# Patient Record
Sex: Male | Born: 1975 | Race: White | Hispanic: No | Marital: Single | Smoking: Former smoker
Health system: Southern US, Community
[De-identification: ages and names within clinical notes are randomized; demographics above are authoritative.]

## PROBLEM LIST (undated history)

## (undated) DIAGNOSIS — I639 Cerebral infarction, unspecified: Secondary | ICD-10-CM

## (undated) DIAGNOSIS — A4902 Methicillin resistant Staphylococcus aureus infection, unspecified site: Secondary | ICD-10-CM

## (undated) DIAGNOSIS — B958 Unspecified staphylococcus as the cause of diseases classified elsewhere: Secondary | ICD-10-CM

## (undated) DIAGNOSIS — I10 Essential (primary) hypertension: Secondary | ICD-10-CM

## (undated) DIAGNOSIS — N200 Calculus of kidney: Secondary | ICD-10-CM

## (undated) HISTORY — PX: DENTAL SURGERY: SHX609

## (undated) SURGERY — Surgical Case
Anesthesia: *Unknown

---

## 2005-02-24 ENCOUNTER — Emergency Department: Payer: Self-pay | Admitting: Emergency Medicine

## 2005-08-02 ENCOUNTER — Emergency Department: Payer: Self-pay | Admitting: General Practice

## 2005-08-05 ENCOUNTER — Emergency Department: Payer: Self-pay | Admitting: Emergency Medicine

## 2006-02-03 ENCOUNTER — Emergency Department: Payer: Self-pay | Admitting: Emergency Medicine

## 2006-02-04 ENCOUNTER — Emergency Department (HOSPITAL_COMMUNITY): Admission: EM | Admit: 2006-02-04 | Discharge: 2006-02-04 | Payer: Self-pay | Admitting: *Deleted

## 2006-03-14 ENCOUNTER — Emergency Department: Payer: Self-pay | Admitting: General Practice

## 2006-03-14 ENCOUNTER — Other Ambulatory Visit: Payer: Self-pay

## 2006-03-15 ENCOUNTER — Emergency Department: Payer: Self-pay | Admitting: Emergency Medicine

## 2006-03-16 ENCOUNTER — Ambulatory Visit: Payer: Self-pay | Admitting: Emergency Medicine

## 2006-03-20 ENCOUNTER — Ambulatory Visit: Payer: Self-pay | Admitting: General Surgery

## 2006-10-13 ENCOUNTER — Emergency Department: Payer: Self-pay | Admitting: General Practice

## 2007-02-13 ENCOUNTER — Emergency Department: Payer: Self-pay | Admitting: Emergency Medicine

## 2007-11-27 ENCOUNTER — Other Ambulatory Visit: Payer: Self-pay

## 2007-11-27 ENCOUNTER — Emergency Department: Payer: Self-pay | Admitting: Emergency Medicine

## 2009-06-13 ENCOUNTER — Emergency Department: Payer: Self-pay | Admitting: Emergency Medicine

## 2010-05-03 ENCOUNTER — Emergency Department: Payer: Self-pay | Admitting: Emergency Medicine

## 2011-05-08 ENCOUNTER — Emergency Department: Payer: Self-pay | Admitting: Emergency Medicine

## 2013-05-03 ENCOUNTER — Emergency Department: Payer: Self-pay | Admitting: Emergency Medicine

## 2013-05-03 LAB — CBC
HCT: 41.7 % (ref 40.0–52.0)
MCHC: 34.4 g/dL (ref 32.0–36.0)
RBC: 4.61 10*6/uL (ref 4.40–5.90)
RDW: 13.5 % (ref 11.5–14.5)
WBC: 15.2 10*3/uL — ABNORMAL HIGH (ref 3.8–10.6)

## 2013-05-03 LAB — URINALYSIS, COMPLETE
Blood: NEGATIVE
Glucose,UR: NEGATIVE mg/dL (ref 0–75)
Nitrite: NEGATIVE
Ph: 7 (ref 4.5–8.0)
Protein: 30
RBC,UR: <1 /HPF (ref 0–5)
Specific Gravity: 1.025 (ref 1.003–1.030)
Squamous Epithelial: <1
WBC UR: 2 /HPF (ref 0–5)

## 2013-05-03 LAB — BASIC METABOLIC PANEL
Anion Gap: 4 — ABNORMAL LOW (ref 7–16)
EGFR (African American): >60
Glucose: 95 mg/dL (ref 65–99)
Osmolality: 282 (ref 275–301)
Sodium: 141 mmol/L (ref 136–145)

## 2015-05-01 ENCOUNTER — Other Ambulatory Visit: Payer: Self-pay

## 2015-05-01 ENCOUNTER — Encounter: Payer: Self-pay | Admitting: Medical Oncology

## 2015-05-01 ENCOUNTER — Emergency Department
Admission: EM | Admit: 2015-05-01 | Discharge: 2015-05-01 | Disposition: A | Discharge status: Home / Self Care | Payer: Self-pay | Attending: Emergency Medicine | Admitting: Emergency Medicine

## 2015-05-01 ENCOUNTER — Emergency Department: Payer: Self-pay

## 2015-05-01 DIAGNOSIS — H5711 Ocular pain, right eye: Secondary | ICD-10-CM | POA: Insufficient documentation

## 2015-05-01 DIAGNOSIS — R5383 Other fatigue: Secondary | ICD-10-CM | POA: Insufficient documentation

## 2015-05-01 DIAGNOSIS — R5381 Other malaise: Secondary | ICD-10-CM

## 2015-05-01 DIAGNOSIS — Z720 Tobacco use: Secondary | ICD-10-CM | POA: Insufficient documentation

## 2015-05-01 LAB — CBC
HEMATOCRIT: 43.6 % (ref 40.0–52.0)
HEMOGLOBIN: 14.5 g/dL (ref 13.0–18.0)
MCH: 30.6 pg (ref 26.0–34.0)
MCHC: 33.3 g/dL (ref 32.0–36.0)
MCV: 91.9 fL (ref 80.0–100.0)
Platelets: 352 10*3/uL (ref 150–440)
RBC: 4.74 MIL/uL (ref 4.40–5.90)
RDW: 14.4 % (ref 11.5–14.5)
WBC: 9.1 10*3/uL (ref 3.8–10.6)

## 2015-05-01 LAB — URINALYSIS COMPLETE WITH MICROSCOPIC (ARMC ONLY)
BILIRUBIN URINE: NEGATIVE
Bacteria, UA: NONE SEEN
Glucose, UA: NEGATIVE mg/dL
HGB URINE DIPSTICK: NEGATIVE
KETONES UR: NEGATIVE mg/dL
LEUKOCYTES UA: NEGATIVE
Nitrite: NEGATIVE
PH: 5 (ref 5.0–8.0)
Protein, ur: NEGATIVE mg/dL
Specific Gravity, Urine: 1.017 (ref 1.005–1.030)

## 2015-05-01 LAB — BASIC METABOLIC PANEL
ANION GAP: 7 (ref 5–15)
BUN: 13 mg/dL (ref 6–20)
CHLORIDE: 105 mmol/L (ref 101–111)
CO2: 24 mmol/L (ref 22–32)
Calcium: 8.8 mg/dL — ABNORMAL LOW (ref 8.9–10.3)
Creatinine, Ser: 1.09 mg/dL (ref 0.61–1.24)
GFR calc Af Amer: >60 mL/min (ref >60)
GLUCOSE: 120 mg/dL — AB (ref 65–99)
POTASSIUM: 3.5 mmol/L (ref 3.5–5.1)
Sodium: 136 mmol/L (ref 135–145)

## 2015-05-01 MED ORDER — TETRACAINE HCL 0.5 % OP SOLN
1.0000 [drp] | Freq: Once | OPHTHALMIC | Status: AC
  Administered 2015-05-01: 1 [drp] via OPHTHALMIC
  Filled 2015-05-01: qty 2

## 2015-05-01 MED ORDER — FLUORESCEIN SODIUM 1 MG OP STRP
1.0000 | ORAL_STRIP | Freq: Once | OPHTHALMIC | Status: AC
  Administered 2015-05-01: 1 via OPHTHALMIC
  Filled 2015-05-01: qty 1

## 2015-05-01 NOTE — ED Provider Notes (Signed)
[facility] Emergency Department Provider Note  ____________________________________________  Time seen: Approximately 10:24 PM  I have reviewed the triage vital signs and the nursing notes.   HISTORY  Chief Complaint Numbness and Dizziness    HPI Steven Mclean is a 39 y.o. male with vague symptoms that started about 4 days ago.  [place] the [place] and reports that he was having some difficulty with ambulation, some left-sided numbness, some stinging and burning in his right eye, and that he just "does not feel normal".  He denies fever/chills, chest pain, shortness of breath, abdominal pain, nausea, vomiting, diarrhea, and dysuria.  He has never had symptoms like this in the past.  He does not have a primary care doctor.  Regarding his right eye pain, it is been intermittent for several days.  He recalls no trauma or foreign body exposure.   History reviewed. No pertinent past medical history.  There are no active problems to display for this patient.   Past Surgical History  Procedure Laterality Date  . Dental surgery      Current Outpatient Rx  Name  Route  Sig  Dispense  Refill  . diphenhydrAMINE (SOMINEX) 25 MG tablet   Oral   Take 25 mg by mouth at bedtime as needed for allergies or sleep.         Marland Kitchen ibuprofen (ADVIL,MOTRIN) 200 MG tablet   Oral   Take 200 mg by mouth every 6 (six) hours as needed.           Allergies Review of patient's allergies indicates no known allergies.  No family history on file.  Social History Social History  Substance Use Topics  . Smoking status: Current Every Day Smoker -- 1.00 packs/day    Types: Cigarettes  . Smokeless tobacco: None  . Alcohol Use: No    Review of Systems Constitutional: No fever/chills.  General malaise. Eyes: No visual changes.  Pain in the right eye that is burning and intermittent. ENT: No sore throat. Cardiovascular: Denies chest pain. Respiratory: Denies shortness of  breath. Gastrointestinal: No abdominal pain.  No nausea, no vomiting.  No diarrhea.  No constipation. Genitourinary: Negative for dysuria. Musculoskeletal: Negative for back pain. Skin: Negative for rash. Neurological: Hx of weakness, more left than right, non-specific  10-point ROS otherwise negative.  ____________________________________________   PHYSICAL EXAM:  VITAL SIGNS: ED Triage Vitals  Enc Vitals Group     BP 05/01/15 1807 140/81 mmHg     Pulse Rate 05/01/15 1807 82     Resp 05/01/15 1807 18     Temp 05/01/15 1807 97.8 F (36.6 C)     Temp Source 05/01/15 1807 Oral     SpO2 05/01/15 1807 98 %     Weight 05/01/15 1807 170 lb (77.111 kg)     Height 05/01/15 1807  (1.727 m)     Head Cir --      Peak Flow --      Pain Score --      Pain Loc --      Pain Edu? --      Excl. in GC? --     Constitutional: Alert and oriented. Well appearing and in no acute distress.  Appears very healthy at baseline - well-developed and muscular. Eyes: Conjunctivae are normal. PERRL. EOMI.  Performed right eye exam with tetracaine, fluorescein and Wood's lamp with no evidence of corneal abrasion, foreign body, dendritic pattern, etc.;  Normal exam, no Seidel sign  Head: Atraumatic. Nose: No congestion/rhinnorhea. Mouth/Throat: Mucous membranes are moist.  Oropharynx non-erythematous. Neck: No stridor.   Cardiovascular: Normal rate, regular rhythm. Grossly normal heart sounds.  Good peripheral circulation. Respiratory: Normal respiratory effort.  No retractions. Lungs CTAB. Gastrointestinal: Soft and nontender. No distention. No abdominal bruits. No CVA tenderness. Musculoskeletal: No lower extremity tenderness nor edema.  No joint effusions. Neurologic:  Normal speech and language. No gross focal neurologic deficits are appreciated.  Patient has strength intact in all 4 extremities.  He has normal sensation throughout.  His cerebellar exam is normal.  He ambulates easily with a normal  gait. Skin:  Skin is warm, dry and intact. No rash noted. Psychiatric: Mood and affect are normal. Speech and behavior are normal.  ____________________________________________   LABS (all labs ordered are listed, but only abnormal results are displayed)  Labs Reviewed  BASIC METABOLIC PANEL - Abnormal; Notable for the following:    Glucose, Bld 120 (*)    Calcium 8.8 (*)    All other components within normal limits  URINALYSIS COMPLETEWITH MICROSCOPIC ([facility] ONLY) - Abnormal; Notable for the following:    Color, Urine YELLOW (*)    APPearance CLEAR (*)    Squamous Epithelial / LPF 0-5 (*)    All other components within normal limits  CBC  CBG MONITORING, ED   ____________________________________________  EKG  ED ECG REPORT I, Jalexus Brett, the attending physician, personally viewed and interpreted this ECG.  Date: 05/01/2015 EKG Time: 18:16 Rate: 74 Rhythm: normal sinus rhythm QRS Axis: Left axis deviation Intervals: Incomplete right bundle branch block ST/T Wave abnormalities: normal Conduction Disutrbances: none Narrative Interpretation: unremarkable  ____________________________________________  RADIOLOGY   Dg Chest 2 View  05/01/2015   CLINICAL DATA:  Weakness to the left side of body. Eyes burning, lips tingling, disorientation, and dizziness for 3 days. Smoker.  EXAM: CHEST  2 VIEW  COMPARISON:  05/08/2011  FINDINGS: Hyperinflation. The heart size and mediastinal contours are within normal limits. Both lungs are clear. The visualized skeletal structures are unremarkable.  IMPRESSION: No active cardiopulmonary disease.   Electronically Signed   By: Burman Nieves M.D.   On: 05/01/2015 22:46   Ct Head Wo Contrast  05/01/2015   CLINICAL DATA:  LEFT-sided arm and facial weakness starting 3 days ago.  EXAM: CT HEAD WITHOUT CONTRAST  TECHNIQUE: Contiguous axial images were obtained from the base of the skull through the vertex without intravenous contrast.   COMPARISON:  None.  FINDINGS: No acute intracranial hemorrhage. No focal mass lesion. No CT evidence of acute infarction. No midline shift or mass effect. No hydrocephalus. Basilar cisterns are patent.  Paranasal sinuses and  mastoid air cells are clear.  IMPRESSION: Normal head CT.   Electronically Signed   By: Genevive Bi M.D.   On: 05/01/2015 19:30    ____________________________________________   PROCEDURES  Procedure(s) performed: None  Critical Care performed: No ____________________________________________   INITIAL IMPRESSION / ASSESSMENT AND PLAN / ED COURSE  Pertinent labs & imaging results that were available during my care of the patient were reviewed by me and considered in my medical decision making (see chart for details).  The patient is well-appearing, healthy, and in no acute distress.  His neurological exam is intact.  He has had no chest pain or shortness of breath.  His workup is unremarkable.  He has no evidence of any injury to his right eye.  I gave him reassurance and recommended that he establish a primary care doctor  [facility].  I am also giving him the name of the local ophthalmologist for follow-up.  He continues to have pain in his right eye.  ____________________________________________  FINAL CLINICAL IMPRESSION(S) / ED DIAGNOSES  Final diagnoses:  Malaise and fatigue  Acute right eye pain      NEW MEDICATIONS STARTED DURING THIS VISIT:  New Prescriptions   No medications on file     Loleta Rose, MD 05/01/15 2318

## 2015-05-01 NOTE — ED Notes (Signed)
Pt ambulatory to triage with reports that he began having left sided numbness and tingling Friday morning, pt reports that since then he has began to have dizziness and states that he just does not feel normal. Denies Chest pain/headache.

## 2015-05-01 NOTE — Discharge Instructions (Signed)
As we discussed, your workup today was reassuring.  Though we do not know exactly what is causing your symptoms, it appears that you have no emergent medical condition at this time are safe to go home and follow up as recommended in this paperwork.  Please return immediately to the Emergency Department if you develop any new or worsening symptoms that concern you.   Weakness Weakness is a lack of strength. You may feel weak all over your body or just in one part of your body. Weakness can be serious. In some cases, you may need more medical tests. HOME CARE  Rest.  Eat a well-balanced diet.  Try to exercise every day.  Only take medicines as told by your doctor. GET HELP RIGHT AWAY IF:   You cannot do your normal daily activities.  You cannot walk up and down stairs, or you feel very tired when you do so.  You have shortness of breath or chest pain.  You have trouble moving parts of your body.  You have weakness in only one body part or on only one side of the body.  You have a fever.  You have trouble speaking or swallowing.  You cannot control when you pee (urinate) or poop (bowel movement).  You have black or bloody throw up (vomit) or poop.  Your weakness gets worse or spreads to other body parts.  You have new aches or pains. MAKE SURE YOU:   Understand these instructions.  Will watch your condition.  Will get help right away if you are not doing well or get worse. Document Released: 07/25/2008 Document Revised: 02/11/2012 Document Reviewed: 10/11/2011 [facility] Patient Information 2015 [place], [place]. This information is not intended to replace advice given to you by your health care provider. Make sure you discuss any questions you have with your health care provider.

## 2015-05-01 NOTE — ED Notes (Signed)
Medications given to MD per request. Steven Mclean lamp placed at pts bedside.

## 2015-05-27 DIAGNOSIS — I639 Cerebral infarction, unspecified: Secondary | ICD-10-CM

## 2015-05-27 HISTORY — DX: Cerebral infarction, unspecified: I63.9

## 2015-06-09 ENCOUNTER — Inpatient Hospital Stay
Admit: 2015-06-09 | Discharge: 2015-06-09 | Disposition: A | Discharge status: Home / Self Care | Payer: Self-pay | Attending: Internal Medicine | Admitting: Internal Medicine

## 2015-06-09 ENCOUNTER — Emergency Department: Payer: Self-pay

## 2015-06-09 ENCOUNTER — Inpatient Hospital Stay
Admission: EM | Admit: 2015-06-09 | Discharge: 2015-06-13 | DRG: 066 | Disposition: A | Discharge status: Home / Self Care | Payer: Self-pay | Attending: Internal Medicine | Admitting: Internal Medicine

## 2015-06-09 ENCOUNTER — Inpatient Hospital Stay: Payer: Self-pay

## 2015-06-09 ENCOUNTER — Emergency Department
Admission: EM | Admit: 2015-06-09 | Discharge: 2015-06-09 | Discharge status: Against Medical Advice | Payer: Self-pay | Attending: Emergency Medicine | Admitting: Emergency Medicine

## 2015-06-09 DIAGNOSIS — Z79899 Other long term (current) drug therapy: Secondary | ICD-10-CM

## 2015-06-09 DIAGNOSIS — Z716 Tobacco abuse counseling: Secondary | ICD-10-CM

## 2015-06-09 DIAGNOSIS — R4781 Slurred speech: Secondary | ICD-10-CM | POA: Diagnosis present

## 2015-06-09 DIAGNOSIS — B349 Viral infection, unspecified: Secondary | ICD-10-CM | POA: Diagnosis present

## 2015-06-09 DIAGNOSIS — I2510 Atherosclerotic heart disease of native coronary artery without angina pectoris: Secondary | ICD-10-CM | POA: Diagnosis present

## 2015-06-09 DIAGNOSIS — I639 Cerebral infarction, unspecified: Principal | ICD-10-CM | POA: Diagnosis present

## 2015-06-09 DIAGNOSIS — I739 Peripheral vascular disease, unspecified: Secondary | ICD-10-CM | POA: Diagnosis present

## 2015-06-09 DIAGNOSIS — K219 Gastro-esophageal reflux disease without esophagitis: Secondary | ICD-10-CM | POA: Diagnosis present

## 2015-06-09 DIAGNOSIS — R29810 Facial weakness: Secondary | ICD-10-CM | POA: Diagnosis present

## 2015-06-09 DIAGNOSIS — I69391 Dysphagia following cerebral infarction: Secondary | ICD-10-CM

## 2015-06-09 DIAGNOSIS — R509 Fever, unspecified: Secondary | ICD-10-CM | POA: Insufficient documentation

## 2015-06-09 DIAGNOSIS — R471 Dysarthria and anarthria: Secondary | ICD-10-CM | POA: Diagnosis present

## 2015-06-09 DIAGNOSIS — Z7902 Long term (current) use of antithrombotics/antiplatelets: Secondary | ICD-10-CM

## 2015-06-09 DIAGNOSIS — R1310 Dysphagia, unspecified: Secondary | ICD-10-CM | POA: Diagnosis present

## 2015-06-09 DIAGNOSIS — Z720 Tobacco use: Secondary | ICD-10-CM | POA: Insufficient documentation

## 2015-06-09 DIAGNOSIS — Z7982 Long term (current) use of aspirin: Secondary | ICD-10-CM

## 2015-06-09 DIAGNOSIS — E785 Hyperlipidemia, unspecified: Secondary | ICD-10-CM | POA: Diagnosis present

## 2015-06-09 DIAGNOSIS — I63019 Cerebral infarction due to thrombosis of unspecified vertebral artery: Secondary | ICD-10-CM

## 2015-06-09 DIAGNOSIS — F17210 Nicotine dependence, cigarettes, uncomplicated: Secondary | ICD-10-CM | POA: Diagnosis present

## 2015-06-09 LAB — URINE DRUG SCREEN, QUALITATIVE (ARMC ONLY)
AMPHETAMINES, UR SCREEN: NOT DETECTED
BARBITURATES, UR SCREEN: NOT DETECTED
Benzodiazepine, Ur Scrn: NOT DETECTED
COCAINE METABOLITE, UR ~~LOC~~: NOT DETECTED
Cannabinoid 50 Ng, Ur ~~LOC~~: POSITIVE — AB
MDMA (Ecstasy)Ur Screen: NOT DETECTED
METHADONE SCREEN, URINE: NOT DETECTED
OPIATE, UR SCREEN: NOT DETECTED
PHENCYCLIDINE (PCP) UR S: NOT DETECTED
Tricyclic, Ur Screen: NOT DETECTED

## 2015-06-09 LAB — CBC WITH DIFFERENTIAL/PLATELET
Basophils Absolute: 0.1 10*3/uL (ref 0–0.1)
Basophils Relative: 1 %
Eosinophils Absolute: 0.1 10*3/uL (ref 0–0.7)
Eosinophils Relative: 1 %
HCT: 43.6 % (ref 40.0–52.0)
Hemoglobin: 14.4 g/dL (ref 13.0–18.0)
Lymphocytes Relative: 18 %
Lymphs Abs: 2.1 10*3/uL (ref 1.0–3.6)
MCH: 29.8 pg (ref 26.0–34.0)
MCHC: 32.9 g/dL (ref 32.0–36.0)
MCV: 90.6 fL (ref 80.0–100.0)
Monocytes Absolute: 0.7 10*3/uL (ref 0.2–1.0)
Monocytes Relative: 7 %
Neutro Abs: 8.4 10*3/uL — ABNORMAL HIGH (ref 1.4–6.5)
Neutrophils Relative %: 73 %
Platelets: 384 10*3/uL (ref 150–440)
RBC: 4.81 MIL/uL (ref 4.40–5.90)
RDW: 14.7 % — ABNORMAL HIGH (ref 11.5–14.5)
WBC: 11.5 10*3/uL — ABNORMAL HIGH (ref 3.8–10.6)

## 2015-06-09 LAB — COMPREHENSIVE METABOLIC PANEL
ALT: 40 U/L (ref 17–63)
AST: 25 U/L (ref 15–41)
Albumin: 4 g/dL (ref 3.5–5.0)
Alkaline Phosphatase: 79 U/L (ref 38–126)
Anion gap: 6 (ref 5–15)
BUN: 13 mg/dL (ref 6–20)
CO2: 24 mmol/L (ref 22–32)
Calcium: 9.4 mg/dL (ref 8.9–10.3)
Chloride: 112 mmol/L — ABNORMAL HIGH (ref 101–111)
Creatinine, Ser: 0.84 mg/dL (ref 0.61–1.24)
GFR calc Af Amer: >60 mL/min (ref >60)
GFR calc non Af Amer: >60 mL/min (ref >60)
Glucose, Bld: 107 mg/dL — ABNORMAL HIGH (ref 65–99)
Potassium: 4.3 mmol/L (ref 3.5–5.1)
Sodium: 142 mmol/L (ref 135–145)
Total Bilirubin: 0.7 mg/dL (ref 0.3–1.2)
Total Protein: 7.1 g/dL (ref 6.5–8.1)

## 2015-06-09 LAB — URINALYSIS COMPLETE WITH MICROSCOPIC (ARMC ONLY)
Bacteria, UA: NONE SEEN
Bilirubin Urine: NEGATIVE
Glucose, UA: NEGATIVE mg/dL
Hgb urine dipstick: NEGATIVE
Ketones, ur: NEGATIVE mg/dL
Leukocytes, UA: NEGATIVE
Nitrite: NEGATIVE
Protein, ur: NEGATIVE mg/dL
RBC / HPF: NONE SEEN RBC/hpf (ref 0–5)
Specific Gravity, Urine: 1.019 (ref 1.005–1.030)
Squamous Epithelial / HPF: NONE SEEN
WBC, UA: NONE SEEN WBC/hpf (ref 0–5)
pH: 7 (ref 5.0–8.0)

## 2015-06-09 LAB — TROPONIN I: Troponin I: <0.03 ng/mL (ref <0.031)

## 2015-06-09 LAB — ANTITHROMBIN III: AntiThromb III Func: 107 % (ref 75–120)

## 2015-06-09 LAB — SALICYLATE LEVEL: Salicylate Lvl: <4 mg/dL (ref 2.8–30.0)

## 2015-06-09 LAB — ACETAMINOPHEN LEVEL

## 2015-06-09 LAB — ETHANOL: Alcohol, Ethyl (B): <5 mg/dL (ref <5)

## 2015-06-09 MED ORDER — STROKE: EARLY STAGES OF RECOVERY BOOK
Freq: Once | Status: AC
  Administered 2015-06-09: 19:00:00

## 2015-06-09 MED ORDER — INFLUENZA VAC SPLIT QUAD 0.5 ML IM SUSY
0.5000 mL | PREFILLED_SYRINGE | INTRAMUSCULAR | Status: AC
Start: 2015-06-10 — End: 2015-06-13
  Administered 2015-06-13: 11:00:00 0.5 mL via INTRAMUSCULAR
  Filled 2015-06-09: qty 0.5

## 2015-06-09 MED ORDER — HEPARIN SODIUM (PORCINE) 5000 UNIT/ML IJ SOLN
5000.0000 [IU] | Freq: Three times a day (TID) | INTRAMUSCULAR | Status: DC
  Administered 2015-06-09 – 2015-06-13 (×10): 5000 [IU] via SUBCUTANEOUS
  Filled 2015-06-09 (×10): qty 1

## 2015-06-09 MED ORDER — ASPIRIN 81 MG PO CHEW
81.0000 mg | CHEWABLE_TABLET | Freq: Every day | ORAL | Status: DC
  Filled 2015-06-09: qty 1

## 2015-06-09 MED ORDER — PNEUMOCOCCAL VAC POLYVALENT 25 MCG/0.5ML IJ INJ
0.5000 mL | INJECTION | INTRAMUSCULAR | Status: AC
  Administered 2015-06-13: 0.5 mL via INTRAMUSCULAR
  Filled 2015-06-09: qty 0.5

## 2015-06-09 MED ORDER — GADOBENATE DIMEGLUMINE 529 MG/ML IV SOLN
15.0000 mL | Freq: Once | INTRAVENOUS | Status: AC | PRN
  Administered 2015-06-09: 15 mL via INTRAVENOUS

## 2015-06-09 MED ORDER — ASPIRIN 300 MG RE SUPP
300.0000 mg | Freq: Once | RECTAL | Status: DC
  Filled 2015-06-09: qty 1

## 2015-06-09 MED ORDER — ASPIRIN 81 MG PO CHEW
324.0000 mg | CHEWABLE_TABLET | Freq: Once | ORAL | Status: DC
  Filled 2015-06-09: qty 4

## 2015-06-09 NOTE — ED Notes (Signed)
Patient transported to CT 

## 2015-06-09 NOTE — ED Notes (Signed)
Patient transported to MRI 

## 2015-06-09 NOTE — ED Notes (Signed)
Patient transported for ECHO 

## 2015-06-09 NOTE — ED Provider Notes (Signed)
[facility] Emergency Department Provider Note  Time seen: 10:36 AM  I have reviewed the triage vital signs and the nursing notes.   HISTORY  Chief Complaint Altered Mental Status    HPI Steven Mclean is a 39 y.o. male with no past medical history who presents the emergency department with altered mental status. According to the patient for the last few days he has felt subjective fevers and chills, shaking and times, unable to eat. Mother is here with the patient states the patient will intermittently have slurred speech, then seems to resolve. Will intermittently not use his right arm, but then resolves, will occasionally be sweaty and complaining of how hot he is, and then at times complaining of how cold he is. Currently patient can only give a limited history. He is awake and alert, follows commands with limited effort. Has very tremulous speech, with a mild right facial droop. Denies drug use. Denies alcohol use.    History reviewed. No pertinent past medical history.  There are no active problems to display for this patient.   Past Surgical History  Procedure Laterality Date  . Dental surgery      Current Outpatient Rx  Name  Route  Sig  Dispense  Refill  . diphenhydrAMINE (SOMINEX) 25 MG tablet   Oral   Take 25 mg by mouth at bedtime as needed for allergies or sleep.         Marland Kitchen. ibuprofen (ADVIL,MOTRIN) 200 MG tablet   Oral   Take 200 mg by mouth every 6 (six) hours as needed.           Allergies Review of patient's allergies indicates no known allergies.  No family history on file.  Social History Social History  Substance Use Topics  . Smoking status: Current Every Day Smoker -- 1.00 packs/day    Types: Cigarettes  . Smokeless tobacco: None  . Alcohol Use: No    Review of Systems Constitutional: As it for subjective fevers/chills. Cardiovascular: Negative for chest pain. Respiratory: Negative for shortness of  breath. Gastrointestinal: Negative for abdominal pain. Mom states one episode of vomiting this morning. Genitourinary: Negative for dysuria. Neurological: Negative for . Occasional right arm stiffness. 10-point ROS otherwise negative.  ____________________________________________   PHYSICAL EXAM:  VITAL SIGNS: ED Triage Vitals  Enc Vitals Group     BP 06/09/15 1011 126/73 mmHg     Pulse Rate 06/09/15 1011 69     Resp 06/09/15 1011 18     Temp 06/09/15 1011 98.1 F (36.7 C)     Temp Source 06/09/15 1011 Oral     SpO2 06/09/15 1011 98 %     Weight 06/09/15 1011 165 lb (74.844 kg)     Height 06/09/15 1011 5\' 8"  (1.727 m)     Head Cir --      Peak Flow --      Pain Score --      Pain Loc --      Pain Edu? --      Excl. in GC? --    Constitutional: Alert. Will open eyes to voice, anterior some questions, very tremulous speech, difficult to understand. Most of the history is provided by his mother. Eyes: Normal exam, 3 mm PERRL ENT   Head: Normocephalic and atraumatic.   Mouth/Throat: Mucous membranes are moist. Cardiovascular: Normal rate, regular rhythm. No murmur Respiratory: Normal respiratory effort without tachypnea nor retractions. Breath sounds are clear  Gastrointestinal: Soft and nontender. No distention.  Musculoskeletal: Nontender with normal range of motion in all extremities.  Neurologic:  Tremulous speech, difficult to understand. Decreased strength in the right arm. Mild right facial droop present. It is difficult to discern what is effort related and what is a true neurologic deficit. Patient will occasionally use his right arm and appears to have full strength. Patient does have a mild right facial droop, but at times this also dissipates. Skin:  Skin is warm, dry and intact.  Psychiatric: Mood and affect are normal. Speech and behavior are normal.  ____________________________________________    EKG  EKG reviewed and interpreted by massage shows  sinus tachycardia 56 bpm, narrow QRS, normal axis, normal intervals, no concerning ST changes noted.  ____________________________________________    RADIOLOGY  CT within normal limits. Chest x-ray within normal limits. MRI shows acute pontine infarction. ____________________________________________    INITIAL IMPRESSION / ASSESSMENT AND PLAN / ED COURSE  Pertinent labs & imaging results that were available during my care of the patient were reviewed by me and considered in my medical decision making (see chart for details).  Patient presents for some confusion, tremulous speech, and possible neurologic deficit of the right arm and right face. At this time it is not clear what is causing his symptoms. The history is very difficult to obtain. The mother who gives most of the history does not live with the patient. Patient will not talk in sentences, will only use 1 or 2 words to answer questions. We will check brought labs, CT head, chest x-ray, EKG to help further evaluate. We'll closely monitor in the emergency department on telemetry. Of note the patient was in the emergency department overnight, states he waited several hours but was not seen so he left prior to being seen.  CT and chest x-ray show no acute findings. Labs are largely within normal limits besides cannabinoid positive urine tox. Patient's family is here now, they state the patient had the exact same symptoms of tremulous speech in September except at that time he was numb in his left side. He was seen in the emergency department with a workup showing no acute findings and discharged home. Given the patient's symptoms, tremulous speech, and right deficits we'll proceed with an MRI to rule out intracranial abnormalities such as CVA or MS.  MRI shows acute pontine infarction. This could possibly be the cause of the patient's symptoms today. We'll admit to the hospital for further evaluation and workup. We will dose aspirin in  the emergency department. Patient agreeable to plan.  ____________________________________________   FINAL CLINICAL IMPRESSION(S) / ED DIAGNOSES  Right-sided numbness Dysarthria   Minna Antis, MD 06/09/15 1456

## 2015-06-09 NOTE — Plan of Care (Signed)
Problem: Discharge/Transitional Outcomes Goal: Barriers To Progression Addressed/Resolved Individualization: Pt has no medical history.  Lives at home with spouse. Pt currently low fall risk per fall risk assessment, but bed alarm activated due to impulsiveness. Pt and spouse in agreement. Pt uses the urinal independently.    Goal: Hemodynamically stable Outcome: Progressing VSS. Denies pain. NIH score 3. A&Ox4, but impulsive. 2nd Swallow screen assessment done per Dr Juliene PinaMody, pt failed. Aspirin PR ordered pt refused.

## 2015-06-09 NOTE — ED Notes (Signed)
Pt c/o intermittent sweats with nausea for several days, was seen here last night for the same sx, pt was diaphoretic and pale on arrival and that has resolved in triage..states he will having sudden slurred speech, shivering, diaphoresis with nausea and then clear up after a few minutes..Marland Kitchen

## 2015-06-09 NOTE — H&P (Signed)
[facility] - [place] at [facility]   PATIENT NAME: Steven Mclean    MR#:  [id number]  DATE OF BIRTH:  1976-05-17  DATE OF ADMISSION:  06/09/2015  PRIMARY CARE PHYSICIAN: No PCP Per Patient   REQUESTING/REFERRING PHYSICIAN: Dr. Lenard Lance  CHIEF COMPLAINT:   Chief Complaint  Patient presents with  . Altered Mental Status   Right sided weakness, slurred speech  HISTORY OF PRESENT ILLNESS:  Steven Mclean  is a 38 y.o. male with no significant medical history presents to the emergency room with slurred speech, shaking, right arm weakness and altered mental status. He reports that he had similar symptoms about 1 month ago but at that time the weakness was on his left side. Those symptoms resolved. His current symptoms started last night and have gotten progressively worse. He has had shaking, sweats, chills. No nausea vomiting or diarrhea. No chest pain or palpitations. Family has noticed significant change in his speech and right-sided facial droop with some disorientation. They also state that since his last evaluation one month ago he has had difficulty swallowing with choking on water. 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  PAST MEDICAL HISTORY:  History reviewed. No pertinent past medical history.  PAST SURGICAL HISTORY:   Past Surgical History  Procedure Laterality Date  . Dental surgery      SOCIAL HISTORY:   Social History  Substance Use Topics  . Smoking status: Current Every Day Smoker -- 1.00 packs/day    Types: Cigarettes  . Smokeless tobacco: Not on file  . Alcohol Use: No   Denies IV drug use  FAMILY HISTORY:  No family history on file.   Positive for DVT in his mother and stroke in both his grandmother and grandfather on his mother's side  DRUG ALLERGIES:  No Known Allergies  REVIEW OF SYSTEMS:   Review of Systems  Constitutional:  Positive for fever, chills, malaise/fatigue and diaphoresis. Negative for weight loss.  HENT: Negative for congestion and hearing loss.   Eyes: Negative for blurred vision and pain.  Respiratory: Negative for cough, hemoptysis, sputum production, shortness of breath and stridor.   Cardiovascular: Negative for chest pain, palpitations, orthopnea and leg swelling.  Gastrointestinal: Negative for nausea, vomiting, abdominal pain, diarrhea, constipation and blood in stool.  Genitourinary: Negative for dysuria and frequency.  Musculoskeletal: Negative for myalgias, back pain, joint pain and neck pain.  Skin: Negative for rash.  Neurological: Positive for dizziness, tremors, speech change, focal weakness and weakness. Negative for loss of consciousness and headaches.  Endo/Heme/Allergies: Does not bruise/bleed easily.  Psychiatric/Behavioral: Negative for depression and hallucinations. The patient is not nervous/anxious.     MEDICATIONS AT HOME:   Prior to Admission medications   Not on File      VITAL SIGNS:  Blood pressure 143/75, pulse 55, temperature 98.1 F (36.7 C), temperature source Oral, resp. rate 24, height  (1.727 m), weight 74.844 kg (165 lb), SpO2 94 %.  PHYSICAL EXAMINATION:  GENERAL:  39 y.o.-year-old patient lying in the bed anxious, uncomfortable, leaning to the right EYES: Pupils equal, round, reactive to light and accommodation. No scleral icterus. Extraocular muscles intact.  HEENT: Head atraumatic, normocephalic. Oropharynx and nasopharynx clear. Oral mucous membranes pink and moist, poor dentition NECK:  Supple, no jugular venous distention. No thyroid enlargement, no tenderness.  LUNGS: Normal breath sounds bilaterally, no wheezing, rales,rhonchi or crepitation. No use of  accessory muscles of respiration.  CARDIOVASCULAR: S1, S2 normal. No murmurs, rubs, or gallops.  ABDOMEN: Soft, nontender, nondistended. Bowel sounds present. No organomegaly or mass. No  guarding no rebound EXTREMITIES: No pedal edema, cyanosis, or clubbing. Referral pulses 2+ NEUROLOGIC: Right-sided facial droop is present, right upper extremity weak and he keeps flexed, right lower extremity weakness, sensation is intact  PSYCHIATRIC: The patient is alert and oriented x 3. Anxious SKIN: No obvious rash, lesion, or ulcer. Multiple tattoos  LABORATORY PANEL:   CBC  Recent Labs Lab 06/09/15 1026  WBC 11.5*  HGB 14.4  HCT 43.6  PLT 384   ------------------------------------------------------------------------------------------------------------------  Chemistries   Recent Labs Lab 06/09/15 1026  NA 142  K 4.3  CL 112*  CO2 24  GLUCOSE 107*  BUN 13  CREATININE 0.84  CALCIUM 9.4  AST 25  ALT 40  ALKPHOS 79  BILITOT 0.7   ------------------------------------------------------------------------------------------------------------------  Cardiac Enzymes  Recent Labs Lab 06/09/15 1026  TROPONINI <0.03   ------------------------------------------------------------------------------------------------------------------  RADIOLOGY:  Ct Head Wo Contrast  06/09/2015  CLINICAL DATA:  Seven onset of slurred speech. Mild right-sided facial droop EXAM: CT HEAD WITHOUT CONTRAST TECHNIQUE: Contiguous axial images were obtained from the base of the skull through the vertex without intravenous contrast. COMPARISON:  May 01, 2015 FINDINGS: The ventricles are normal in size and configuration. There is no intracranial mass, hemorrhage, extra-axial fluid collection, or midline shift. The gray-white compartments are normal. No acute infarct evident. The bony calvarium appears intact. The mastoid air cells are clear. There is ethmoid sinus disease bilaterally. There is also right sphenoid sinus disease. There is evidence of an old fracture of the right anterior nasal bone. IMPRESSION: Paranasal sinus disease. Old fracture right nasal bone. No intracranial mass,  hemorrhage, or focal gray -white compartment lesions/acute appearing infarct. Electronically Signed   By: Bretta BangWilliam  Woodruff III M.D.   On: 06/09/2015 11:06   Mr Laqueta JeanBrain W ZOWo Contrast  06/09/2015  CLINICAL DATA:  Slurred speech with right-sided weakness. EXAM: MRI HEAD WITHOUT AND WITH CONTRAST TECHNIQUE: Multiplanar, multiecho pulse sequences of the brain and surrounding structures were obtained without and with intravenous contrast. CONTRAST:  15mL MULTIHANCE GADOBENATE DIMEGLUMINE 529 MG/ML IV SOLN COMPARISON:  Head CT from earlier today FINDINGS: Calvarium and upper cervical spine: No focal marrow signal abnormality. Orbits: No significant findings. Sinuses and Mastoids: Sinusitis with mucosal thickening greatest and posterior right ethmoid air cell. Brain: There is restricted diffusion in the left pons consistent with acute infarct. More inferiorly, there is gliosis in the right pons consistent with remote infarct. No other ischemic changes in the remainder of the brain. Normal flow related signal loss in the visualized vertebral and basilar arteries. No abnormal intracranial enhancement. No hemorrhage, hydrocephalus, or mass lesion. IMPRESSION: 1. Acute left pontine infarction. 2. Remote right pontine infarct. 3. No ischemic injury outside of the pons. Electronically Signed   By: Marnee SpringJonathon  Watts M.D.   On: 06/09/2015 14:43   Dg Chest Portable 1 View  06/09/2015  CLINICAL DATA:  Nausea and chills ; altered mental status EXAM: PORTABLE CHEST 1 VIEW COMPARISON:  May 01, 2015 FINDINGS: The lungs are clear. The heart size and pulmonary vascularity are normal. No adenopathy. No bone lesions. IMPRESSION: No edema or consolidation. Electronically Signed   By: Bretta BangWilliam  Woodruff III M.D.   On: 06/09/2015 10:53    EKG:   Orders placed or performed during the hospital encounter of 06/09/15  . ED EKG  . 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 Start aspirin. Obtain echocardiogram, carotid Dopplers, blood cultures, lipids, hypercoagulability panel. Will have physical, speech, occupational therapy consultations. We'll also consult neurology for recurrent strokes in this young patient. He is a smoker and may have family history of hypercoagulability. He is also had recent chills and rigors with negative UA and chest x-ray suggesting possible endocarditis. Denies IV drug use.  CODE STATUS: Full  TOTAL TIME TAKING CARE OF THIS PATIENT: 40 minutes.   Greater than 50% of time spent in coordination of care and counseling. Care plan discussed with the patient and his wife Marylene Land at the bedside.  Elby Showers M.D on 06/09/2015 at 3:35 PM  Between 7am to 6pm - Pager - [phone]  After 6pm go to [email or web address] - password [email or web address] [facility]  [facility]  Office  [phone]  CC: Primary care physician; No PCP Per Patient

## 2015-06-09 NOTE — ED Notes (Signed)
Pt returned from CT scan, pt follows commands to open eyes and identifies visitors in the room, speech slurred

## 2015-06-09 NOTE — ED Notes (Addendum)
Mother at bedside, MD at bedside, pt states name and place, pt trembling with slurred speech, right arm stiff, pt states he is cold one second and sweating the next, mild right sided facial droop noticed, pt denies any ETOH or drug abuse

## 2015-06-09 NOTE — Progress Notes (Signed)
*  PRELIMINARY RESULTS* Echocardiogram 2D Echocardiogram has been performed.  Steven Mclean 06/09/2015, 7:29 PM

## 2015-06-09 NOTE — ED Notes (Signed)
Patient with chills onset about two hours ago. Denies N/V. Denies sick contacts. Able to eat small amounts.

## 2015-06-10 ENCOUNTER — Inpatient Hospital Stay: Payer: Self-pay

## 2015-06-10 ENCOUNTER — Encounter: Payer: Self-pay | Admitting: Radiology

## 2015-06-10 DIAGNOSIS — I6302 Cerebral infarction due to thrombosis of basilar artery: Secondary | ICD-10-CM

## 2015-06-10 LAB — CBC
HCT: 41.2 % (ref 40.0–52.0)
Hemoglobin: 14.1 g/dL (ref 13.0–18.0)
MCH: 31.1 pg (ref 26.0–34.0)
MCHC: 34.3 g/dL (ref 32.0–36.0)
MCV: 90.7 fL (ref 80.0–100.0)
PLATELETS: 325 10*3/uL (ref 150–440)
RBC: 4.54 MIL/uL (ref 4.40–5.90)
RDW: 14.6 % — AB (ref 11.5–14.5)
WBC: 9.9 10*3/uL (ref 3.8–10.6)

## 2015-06-10 LAB — BASIC METABOLIC PANEL
Anion gap: 6 (ref 5–15)
BUN: 15 mg/dL (ref 6–20)
CALCIUM: 9 mg/dL (ref 8.9–10.3)
CO2: 24 mmol/L (ref 22–32)
CREATININE: 0.98 mg/dL (ref 0.61–1.24)
Chloride: 111 mmol/L (ref 101–111)
GLUCOSE: 85 mg/dL (ref 65–99)
Potassium: 3.7 mmol/L (ref 3.5–5.1)
Sodium: 141 mmol/L (ref 135–145)

## 2015-06-10 LAB — LIPID PANEL
CHOL/HDL RATIO: 3 ratio
CHOLESTEROL: 199 mg/dL (ref 0–200)
HDL: 66 mg/dL (ref >40)
LDL CALC: 124 mg/dL — AB (ref 0–99)
Triglycerides: 44 mg/dL (ref <150)
VLDL: 9 mg/dL (ref 0–40)

## 2015-06-10 LAB — HEMOGLOBIN A1C: Hgb A1c MFr Bld: 5.3 % (ref 4.0–6.0)

## 2015-06-10 LAB — TROPONIN I
Troponin I: <0.03 ng/mL (ref <0.031)
Troponin I: <0.03 ng/mL (ref <0.031)

## 2015-06-10 MED ORDER — PRAVASTATIN SODIUM 20 MG PO TABS
40.0000 mg | ORAL_TABLET | Freq: Every day | ORAL | Status: DC
  Administered 2015-06-10 – 2015-06-11 (×2): 40 mg via ORAL
  Filled 2015-06-10 (×2): qty 2

## 2015-06-10 MED ORDER — ASPIRIN EC 325 MG PO TBEC
325.0000 mg | DELAYED_RELEASE_TABLET | Freq: Every day | ORAL | Status: DC
  Administered 2015-06-10: 11:00:00 325 mg via ORAL
  Filled 2015-06-10: qty 1

## 2015-06-10 MED ORDER — IOHEXOL 350 MG/ML SOLN
75.0000 mL | Freq: Once | INTRAVENOUS | Status: AC | PRN
  Administered 2015-06-10: 15:00:00 75 mL via INTRAVENOUS

## 2015-06-10 NOTE — Progress Notes (Signed)
[facility] - [place] at [facility]   PATIENT NAME: Steven Mclean    MR#:  [id number]  DATE OF BIRTH:  Jan 22, 1976  SUBJECTIVE:  CHIEF COMPLAINT:  Patient is resting comfortably. Passed swallow evaluation. Denies any chest pain or shortness of breath. No extremity weakness. Denies any headache.  REVIEW OF SYSTEMS:  CONSTITUTIONAL: No fever, fatigue or weakness.  EYES: No blurred or double vision.  EARS, NOSE, AND THROAT: No tinnitus or ear pain.  RESPIRATORY: No cough, shortness of breath, wheezing or hemoptysis.  CARDIOVASCULAR: No chest pain, orthopnea, edema.  GASTROINTESTINAL: No nausea, vomiting, diarrhea or abdominal pain.  GENITOURINARY: No dysuria, hematuria.  ENDOCRINE: No polyuria, nocturia,  HEMATOLOGY: No anemia, easy bruising or bleeding SKIN: No rash or lesion. MUSCULOSKELETAL: No joint pain or arthritis.   NEUROLOGIC: No tingling, numbness, weakness.  PSYCHIATRY: No anxiety or depression.   DRUG ALLERGIES:  No Known Allergies  VITALS:  Blood pressure 114/58, pulse 56, temperature 97.9 F (36.6 C), temperature source Oral, resp. rate 18, height  (1.727 m), weight 76.295 kg (168 lb 3.2 oz), SpO2 99 %.  PHYSICAL EXAMINATION:  GENERAL:  39 y.o.-year-old patient lying in the bed with no acute distress.  EYES: Pupils equal, round, reactive to light and accommodation. No scleral icterus. Extraocular muscles intact.  HEENT: Head atraumatic, normocephalic. Oropharynx and nasopharynx clear.  NECK:  Supple, no jugular venous distention. No thyroid enlargement, no tenderness.  LUNGS: Normal breath sounds bilaterally, no wheezing, rales,rhonchi or crepitation. No use of accessory muscles of respiration.  CARDIOVASCULAR: S1, S2 normal. No murmurs, rubs, or gallops.  ABDOMEN: Soft, nontender, nondistended. Bowel sounds present. No organomegaly or mass.  EXTREMITIES: No pedal edema, cyanosis, or clubbing.  NEUROLOGIC: Cranial nerves II through XII  are intact. Muscle strength 5/5 in all extremities. Sensation intact. Gait not checked.  PSYCHIATRIC: The patient is alert and oriented x 3.  SKIN: No obvious rash, lesion, or ulcer. Multiple tattoos were noticed on the body   LABORATORY PANEL:   CBC  Recent Labs Lab 06/10/15 0246  WBC 9.9  HGB 14.1  HCT 41.2  PLT 325   ------------------------------------------------------------------------------------------------------------------  Chemistries   Recent Labs Lab 06/09/15 1026 06/10/15 0246  NA 142 141  K 4.3 3.7  CL 112* 111  CO2 24 24  GLUCOSE 107* 85  BUN 13 15  CREATININE 0.84 0.98  CALCIUM 9.4 9.0  AST 25  --   ALT 40  --   ALKPHOS 79  --   BILITOT 0.7  --    ------------------------------------------------------------------------------------------------------------------  Cardiac Enzymes  Recent Labs Lab 06/10/15 1021  TROPONINI <0.03   ------------------------------------------------------------------------------------------------------------------  RADIOLOGY:  Ct Head Wo Contrast  06/09/2015  CLINICAL DATA:  Seven onset of slurred speech. Mild right-sided facial droop EXAM: CT HEAD WITHOUT CONTRAST TECHNIQUE: Contiguous axial images were obtained from the base of the skull through the vertex without intravenous contrast. COMPARISON:  May 01, 2015 FINDINGS: The ventricles are normal in size and configuration. There is no intracranial mass, hemorrhage, extra-axial fluid collection, or midline shift. The gray-white compartments are normal. No acute infarct evident. The bony calvarium appears intact. The mastoid air cells are clear. There is ethmoid sinus disease bilaterally. There is also right sphenoid sinus disease. There is evidence of an old fracture of the right anterior nasal bone. IMPRESSION: Paranasal sinus disease. Old fracture right nasal bone. No intracranial mass, hemorrhage, or focal gray -white compartment lesions/acute appearing infarct.  Electronically Signed   By: Chrissie Noa  Margarita GrizzleWoodruff III M.D.   On: 06/09/2015 11:06   Mr Laqueta JeanBrain W ONWo Contrast  06/09/2015  CLINICAL DATA:  Slurred speech with right-sided weakness. EXAM: MRI HEAD WITHOUT AND WITH CONTRAST TECHNIQUE: Multiplanar, multiecho pulse sequences of the brain and surrounding structures were obtained without and with intravenous contrast. CONTRAST:  15mL MULTIHANCE GADOBENATE DIMEGLUMINE 529 MG/ML IV SOLN COMPARISON:  Head CT from earlier today FINDINGS: Calvarium and upper cervical spine: No focal marrow signal abnormality. Orbits: No significant findings. Sinuses and Mastoids: Sinusitis with mucosal thickening greatest and posterior right ethmoid air cell. Brain: There is restricted diffusion in the left pons consistent with acute infarct. More inferiorly, there is gliosis in the right pons consistent with remote infarct. No other ischemic changes in the remainder of the brain. Normal flow related signal loss in the visualized vertebral and basilar arteries. No abnormal intracranial enhancement. No hemorrhage, hydrocephalus, or mass lesion. IMPRESSION: 1. Acute left pontine infarction. 2. Remote right pontine infarct. 3. No ischemic injury outside of the pons. Electronically Signed   By: Marnee SpringJonathon  Watts M.D.   On: 06/09/2015 14:43   [place] Carotid Bilateral  06/09/2015  CLINICAL DATA:  Stroke, trouble swallowing, difficulty walking, speech disturbance. History of smoking. EXAM: BILATERAL CAROTID DUPLEX ULTRASOUND TECHNIQUE: Wallace CullensGray scale imaging, color Doppler and duplex ultrasound were performed of bilateral carotid and vertebral arteries in the neck. COMPARISON:  Brain MRI -earlier same day; head CT -earlier same day FINDINGS: Criteria: Quantification of carotid stenosis is based on velocity parameters that correlate the residual internal carotid diameter with NASCET-based stenosis levels, using the diameter of the distal internal carotid lumen as the denominator for stenosis measurement. The  following velocity measurements were obtained: RIGHT ICA:  115/36 cm/sec CCA:  155/37 cm/sec SYSTOLIC ICA/CCA RATIO:  0.7 DIASTOLIC ICA/CCA RATIO:  0.7 ECA:  129 cm/sec LEFT ICA:  117/39 cm/sec CCA:  163/37 cm/sec SYSTOLIC ICA/CCA RATIO:  0.7 DIASTOLIC ICA/CCA RATIO:  1.1 ECA:  123 cm/sec RIGHT CAROTID ARTERY: There is a minimal amount of eccentric mixed echogenic plaque within the right carotid bulb (images 16 and 17), extending to involve the origin and proximal aspect of the right internal carotid artery (image 24, not resulting in elevated peak systolic velocities within the interrogated course of the right internal carotid artery to suggest a hemodynamically significant stenosis. RIGHT VERTEBRAL ARTERY:  Antegrade flow LEFT CAROTID ARTERY: There is no grayscale evidence of significant intimal thickening or atherosclerotic plaque affecting the interrogated portions of the left carotid system. There are no elevated peak systolic velocities within the interrogated course of the left internal carotid artery to suggest a hemodynamically significant stenosis. LEFT VERTEBRAL ARTERY:  Antegrade flow IMPRESSION: 1. Very minimal amount of right-sided atherosclerotic plaque, not resulting in a hemodynamically significant stenosis. 2. Normal sonographic evaluation of the left carotid system. Electronically Signed   By: Simonne ComeJohn  Watts M.D.   On: 06/09/2015 16:43   Dg Chest Portable 1 View  06/09/2015  CLINICAL DATA:  Nausea and chills ; altered mental status EXAM: PORTABLE CHEST 1 VIEW COMPARISON:  May 01, 2015 FINDINGS: The lungs are clear. The heart size and pulmonary vascularity are normal. No adenopathy. No bone lesions. IMPRESSION: No edema or consolidation. Electronically Signed   By: Bretta BangWilliam  Woodruff III M.D.   On: 06/09/2015 10:53    EKG:   Orders placed or performed during the hospital encounter of 06/09/15  . ED EKG  . 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  Patient passed bedside swallow evaluation  Start him on statin as LDL is at 124  Obtain echocardiogram,  carotid Dopplers normal  blood cultures pending . Patient is afebrile. Will start him on IV antibiotics if blood cultures are positive hypercoagulability panel pending  Will have physical, recommending outpatient physical therapy .Pending neurology consult for recurrent strokes in this young patient. Cardiology consult is placed for TEE to rule out embolic stroke  He is also had recent chills and rigors with negative UA and chest x-ray suggesting possible endocarditis. Denies IV drug use.  #2 subjective fever with chills Chest x-rays negative, UA is negative, blood cultures are pending. No cardiac murmur noticed Echocardiogram is pending TEE is ordered as there is a concern for embolic stroke Concern for endocarditis by the admitting physician but patient denies any IV drug use. Not febrile. Blood cultures are pending. No cardia murmur noticed. No leukocytosis either. Will consider IV antibiotics if cultures are positive or patient is febrile   #3 dysphagia PPI and outpatient GI follow-up is recommended   #4 tobacco abuse   counseled patient to quit smoking for 3-5 minutes. Agreeable. We'll start him on nicotine patch.      All the records are reviewed and case discussed with Care Management/Social Workerr. Management plans discussed with the patient, family and they are in agreement.  CODE STATUS: full   TOTAL TIME TAKING CARE OF THIS PATIENT: 35  minutes.   POSSIBLE D/C IN 2-3  DAYS, DEPENDING ON CLINICAL CONDITION.   Ramonita Lab M.D on 06/10/2015 at 2:03 PM  Between 7am to 6pm - Pager - [phone] After 6pm go to [email or web address] - password [email or web address] [facility]  [place] [facility]  Office  [phone]  CC: Primary care physician; No PCP Per Patient

## 2015-06-10 NOTE — Evaluation (Signed)
Physical Therapy Evaluation Patient Details Name: Steven Mclean MRN: [id number] DOB: 06-Jun-1976 Today's Date: 06/10/2015   History of Present Illness  Patient is a 39 y/o male that presents with R sided weakness and slurred speech. 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   Clinical Impression  Patient presents with higher level balance deficits during this mobility evaluation secondary to recent CVA(s). Patient is unable to safely get to tandem standing and can maintain, though only for short periods of time SLS. Patient demonstrates lateral loss of balance with head turns, turning corners, avoiding objects, and narrow BOS ambulation. He is able to complete eyes closed and retro ambulation, though noted to drift laterally as well. No sensation deficits, strength deficits, or dizziness complaints identified in this session. Patient demonstrates balance deficits indicative of increased falls risk, especially with his impulsivity. Attempted ambulation with SPC, which seemed to slow him down somewhat and reduce lateral loss of balance. Patient educated extensively on technique with SPC, and though it improved his gait he did struggle with mechanics. Patient would benefit from additional skilled PT services to address his balance deficits.     Follow Up Recommendations Outpatient PT    Equipment Recommendations  Cane    Recommendations for Other Services       Precautions / Restrictions Precautions Precautions: Fall Restrictions Weight Bearing Restrictions: No      Mobility  Bed Mobility Overal bed mobility: Modified Independent             General bed mobility comments: HOB elevated, no balance deficits noted.   Transfers Overall transfer level: Independent               General transfer comment: Patient displays use of RUE on bed rail to stabilize initially in standing on first attempt. Further attempts no loss of balance noted.    Ambulation/Gait Ambulation/Gait assistance: Min guard Ambulation Distance (Feet): 200 Feet (Additional gait with SPC as noted in assessment. )   Gait Pattern/deviations: Staggering left;Staggering right;Drifts right/left;Step-through pattern;Narrow base of support   Gait velocity interpretation: at or above normal speed for age/gender General Gait Details: Patient demonstrates decreased balance and lateral loss of balance with higher level gait tasks, head turns, and rounding corners.  Stairs            Wheelchair Mobility    Modified Rankin (Stroke Patients Only)       Balance                                 Standardized Balance Assessment Standardized Balance Assessment : Berg Balance Test;Dynamic Gait Index Berg Balance Test Sit to Stand: Able to stand  independently using hands Standing Unsupported: Able to stand safely 2 minutes Sitting with Back Unsupported but Feet Supported on Floor or Stool: Able to sit safely and securely 2 minutes Stand to Sit: Controls descent by using hands Transfers: Able to transfer safely, minor use of hands Standing Unsupported with Eyes Closed: Able to stand 10 seconds safely Standing Ubsupported with Feet Together: Able to place feet together independently and stand 1 minute safely From Standing Position, Pick up Object from Floor: Able to pick up shoe safely and easily Turn 360 Degrees: Able to turn 360 degrees safely in 4 seconds or less Standing Unsupported, Alternately Place Feet on Step/Stool: Able to stand independently and safely and complete 8 steps in 20 seconds Standing Unsupported, One Foot  in Front: Loses balance while stepping or standing Standing on One Leg: Able to lift leg independently and hold equal to or more than 3 seconds Dynamic Gait Index Level Surface: Normal Change in Gait Speed: Mild Impairment Gait with Horizontal Head Turns: Mild Impairment Gait with Vertical Head Turns: Mild  Impairment Gait and Pivot Turn: Normal Step Around Obstacles: Mild Impairment Steps: Moderate Impairment       Pertinent Vitals/Pain Pain Assessment: No/denies pain    Home Living Family/patient expects to be discharged to:: Private residence Living Arrangements: Spouse/significant other Available Help at Discharge: Family Type of Home: House Home Access: Stairs to enter;Ramped entrance   Entrance Stairs-Number of Steps: 3 Home Layout: One level   Additional Comments: He moved into his grandfather's old house after he passed. House is completely handicap accessible.     Prior Function Level of Independence: Independent         Comments: Patient reports he has been working, though notes balance deficits over the past month.      Hand Dominance        Extremity/Trunk Assessment   Upper Extremity Assessment: Overall WFL for tasks assessed           Lower Extremity Assessment: Overall WFL for tasks assessed         Communication   Communication: No difficulties  Cognition Arousal/Alertness: Awake/alert Behavior During Therapy: WFL for tasks assessed/performed;Impulsive (Patient does not make eye contact consistently and seems to have a difficult time staying on task) Overall Cognitive Status: Within Functional Limits for tasks assessed                      General Comments      Exercises Other Exercises Other Exercises: Educated and assessed patient with SPC for 300' of ambulation and up/down 4 steps with significant verbal and physical cuing for technique of use of SPC.       Assessment/Plan    PT Assessment Patient needs continued PT services  PT Diagnosis Difficulty walking   PT Problem List Decreased safety awareness;Decreased balance  PT Treatment Interventions DME instruction;Gait training;Stair training;Balance training;Therapeutic exercise;Therapeutic activities;Neuromuscular re-education   PT Goals (Current goals can be found in the  Care Plan section) Acute Rehab PT Goals Patient Stated Goal: To return home safely  PT Goal Formulation: With patient/family Time For Goal Achievement: 06/24/15 Potential to Achieve Goals: Good Additional Goals Additional Goal #1:  (Patient will display low falls risk on standardized outcome )    Frequency 7X/week   Barriers to discharge        Co-evaluation               End of Session Equipment Utilized During Treatment: Gait belt Activity Tolerance: Patient tolerated treatment well Patient left: in bed;with call bell/phone within reach;with family/visitor present;with bed alarm set Nurse Communication: Mobility status         Time: 1207-1231 PT Time Calculation (min) (ACUTE ONLY): 24 min   Charges:   PT Evaluation $Initial PT Evaluation Tier I: 1 Procedure PT Treatments $Gait Training: 8-22 mins   PT G Codes:       Kerin RansomPatrick A McNamara, PT, DPT    06/10/2015, 1:36 PM

## 2015-06-10 NOTE — Plan of Care (Addendum)
Problem: Discharge/Transitional Outcomes Goal: Other Discharge Outcomes/Goals Outcome: Progressing VSS. INH score 2. No neurological changes during the shift. Impulsive. Denies pain. Currently on dysphagia diet, tolerates it well. Not compliant with diet despite education. 1xassist with ambulation. Unsteady gait.

## 2015-06-10 NOTE — Consult Note (Signed)
Reason for Consult:CVA [place] Referring Physician: Dr Volanda Napoleon hospitalist  Steven Mclean is an 39 y.o. male.  HPI: Pt has hx of recent CVA and MRI suggesting multiple areas. Pt now has focal weakness. He has ahx of smoking and hyperlipidemia. No prior CAD or MI. He denies prior CVA or PVD. He now is getting rehab and PT.   History reviewed. No pertinent past medical history.  Past Surgical History  Procedure Laterality Date  . Dental surgery      Family History  Problem Relation Age of Onset  . Deep vein thrombosis Mother   . Stroke Other     Social History:  reports that he has been smoking Cigarettes.  He has been smoking about 1.00 pack per day. He does not have any smokeless tobacco history on file. He reports that he does not drink alcohol or use illicit drugs.  Allergies: No Known Allergies  Medications: I have reviewed the patient's current medications.  Results for orders placed or performed during the hospital encounter of 06/09/15 (from the past 48 hour(s))  Comprehensive metabolic panel     Status: Abnormal   Collection Time: 06/09/15 10:26 AM  Result Value Ref Range   Sodium 142 135 - 145 mmol/L   Potassium 4.3 3.5 - 5.1 mmol/L   Chloride 112 (H) 101 - 111 mmol/L   CO2 24 22 - 32 mmol/L   Glucose, Bld 107 (H) 65 - 99 mg/dL   BUN 13 6 - 20 mg/dL   Creatinine, Ser 0.84 0.61 - 1.24 mg/dL   Calcium 9.4 8.9 - 10.3 mg/dL   Total Protein 7.1 6.5 - 8.1 g/dL   Albumin 4.0 3.5 - 5.0 g/dL   AST 25 15 - 41 U/L   ALT 40 17 - 63 U/L   Alkaline Phosphatase 79 38 - 126 U/L   Total Bilirubin 0.7 0.3 - 1.2 mg/dL   GFR calc non Af Amer >60 >60 mL/min   GFR calc Af Amer >60 >60 mL/min    Comment: (NOTE) The eGFR has been calculated using the CKD EPI equation. This calculation has not been validated in all clinical situations. eGFR's persistently <60 mL/min signify possible Chronic Kidney Disease.    Anion gap 6 5 - 15  Ethanol     Status: None   Collection Time: 06/09/15  10:26 AM  Result Value Ref Range   Alcohol, Ethyl (B) <5 <5 mg/dL    Comment:        LOWEST DETECTABLE LIMIT FOR SERUM ALCOHOL IS 5 mg/dL FOR MEDICAL PURPOSES ONLY   Acetaminophen level     Status: Abnormal   Collection Time: 06/09/15 10:26 AM  Result Value Ref Range   Acetaminophen (Tylenol), Serum <10 (L) 10 - 30 ug/mL    Comment:        THERAPEUTIC CONCENTRATIONS VARY SIGNIFICANTLY. A RANGE OF 10-30 ug/mL MAY BE AN EFFECTIVE CONCENTRATION FOR MANY PATIENTS. HOWEVER, SOME ARE BEST TREATED AT CONCENTRATIONS OUTSIDE THIS RANGE. ACETAMINOPHEN CONCENTRATIONS >150 ug/mL AT 4 HOURS AFTER INGESTION AND >50 ug/mL AT 12 HOURS AFTER INGESTION ARE OFTEN ASSOCIATED WITH TOXIC REACTIONS.   Salicylate level     Status: None   Collection Time: 06/09/15 10:26 AM  Result Value Ref Range   Salicylate Lvl <2.7 2.8 - 30.0 mg/dL  Troponin I     Status: None   Collection Time: 06/09/15 10:26 AM  Result Value Ref Range   Troponin I <0.03 <0.031 ng/mL    Comment:  NO INDICATION OF MYOCARDIAL INJURY.   CBC with Differential     Status: Abnormal   Collection Time: 06/09/15 10:26 AM  Result Value Ref Range   WBC 11.5 (H) 3.8 - 10.6 K/uL   RBC 4.81 4.40 - 5.90 MIL/uL   Hemoglobin 14.4 13.0 - 18.0 g/dL   HCT 05.9 42.3 - 93.3 %   MCV 90.6 80.0 - 100.0 fL   MCH 29.8 26.0 - 34.0 pg   MCHC 32.9 32.0 - 36.0 g/dL   RDW 16.3 (H) 92.2 - 94.1 %   Platelets 384 150 - 440 K/uL   Neutrophils Relative % 73 %   Neutro Abs 8.4 (H) 1.4 - 6.5 K/uL   Lymphocytes Relative 18 %   Lymphs Abs 2.1 1.0 - 3.6 K/uL   Monocytes Relative 7 %   Monocytes Absolute 0.7 0.2 - 1.0 K/uL   Eosinophils Relative 1 %   Eosinophils Absolute 0.1 0 - 0.7 K/uL   Basophils Relative 1 %   Basophils Absolute 0.1 0 - 0.1 K/uL  Urinalysis complete, with microscopic     Status: Abnormal   Collection Time: 06/09/15 11:58 AM  Result Value Ref Range   Color, Urine YELLOW (A) YELLOW   APPearance CLOUDY (A) CLEAR   Glucose,  UA NEGATIVE NEGATIVE mg/dL   Bilirubin Urine NEGATIVE NEGATIVE   Ketones, ur NEGATIVE NEGATIVE mg/dL   Specific Gravity, Urine 1.019 1.005 - 1.030   Hgb urine dipstick NEGATIVE NEGATIVE   pH 7.0 5.0 - 8.0   Protein, ur NEGATIVE NEGATIVE mg/dL   Nitrite NEGATIVE NEGATIVE   Leukocytes, UA NEGATIVE NEGATIVE   RBC / HPF NONE SEEN 0 - 5 RBC/hpf   WBC, UA NONE SEEN 0 - 5 WBC/hpf   Bacteria, UA NONE SEEN NONE SEEN   Squamous Epithelial / LPF NONE SEEN NONE SEEN   Budding Yeast PRESENT    Amorphous Crystal PRESENT   Urine Drug Screen, Qualitative     Status: Abnormal   Collection Time: 06/09/15 11:58 AM  Result Value Ref Range   Tricyclic, Ur Screen NONE DETECTED NONE DETECTED   Amphetamines, Ur Screen NONE DETECTED NONE DETECTED   MDMA (Ecstasy)Ur Screen NONE DETECTED NONE DETECTED   Cocaine Metabolite,Ur [place] NONE DETECTED NONE DETECTED   Opiate, Ur Screen NONE DETECTED NONE DETECTED   Phencyclidine (PCP) Ur S NONE DETECTED NONE DETECTED   Cannabinoid 50 Ng, Ur  POSITIVE (A) NONE DETECTED   Barbiturates, Ur Screen NONE DETECTED NONE DETECTED   Benzodiazepine, Ur Scrn NONE DETECTED NONE DETECTED   Methadone Scn, Ur NONE DETECTED NONE DETECTED    Comment: (NOTE) 100  Tricyclics, urine               Cutoff 1000 ng/mL 200  Amphetamines, urine             Cutoff 1000 ng/mL 300  MDMA (Ecstasy), urine           Cutoff 500 ng/mL 400  Cocaine Metabolite, urine       Cutoff 300 ng/mL 500  Opiate, urine                   Cutoff 300 ng/mL 600  Phencyclidine (PCP), urine      Cutoff 25 ng/mL 700  Cannabinoid, urine              Cutoff 50 ng/mL 800  Barbiturates, urine             Cutoff 200 ng/mL 900  Benzodiazepine, urine           Cutoff 200 ng/mL 1000 Methadone, urine                Cutoff 300 ng/mL 1100 1200 The urine drug screen provides only a preliminary, unconfirmed 1300 analytical test result and should not be used for non-medical 1400 purposes. Clinical consideration and professional  judgment should 1500 be applied to any positive drug screen result due to possible 1600 interfering substances. A more specific alternate chemical method 1700 must be used in order to obtain a confirmed analytical result.  1800 Gas chromato graphy / mass spectrometry (GC/MS) is the preferred 1900 confirmatory method.   Antithrombin III     Status: None   Collection Time: 06/09/15  3:46 PM  Result Value Ref Range   AntiThromb III Func 107 75 - 120 %    Comment: Performed at [facility]  Culture, blood (routine x 2)     Status: None (Preliminary result)   Collection Time: 06/09/15  3:46 PM  Result Value Ref Range   Specimen Description BLOOD RIGHT FOREARM    Special Requests BOTTLES DRAWN AEROBIC AND ANAEROBIC  3CC    Culture NO GROWTH < 24 HOURS    Report Status PENDING   Culture, blood (routine x 2)     Status: None (Preliminary result)   Collection Time: 06/09/15  3:47 PM  Result Value Ref Range   Specimen Description BLOOD LEFT FOREARM    Special Requests BOTTLES DRAWN AEROBIC AND ANAEROBIC  6CC    Culture NO GROWTH < 24 HOURS    Report Status PENDING   Troponin I (q 6hr x 3)     Status: None   Collection Time: 06/09/15  9:32 PM  Result Value Ref Range   Troponin I <0.03 <0.031 ng/mL    Comment:        NO INDICATION OF MYOCARDIAL INJURY.   Troponin I (q 6hr x 3)     Status: None   Collection Time: 06/10/15  2:46 AM  Result Value Ref Range   Troponin I <0.03 <0.031 ng/mL    Comment:        NO INDICATION OF MYOCARDIAL INJURY.   Lipid panel     Status: Abnormal   Collection Time: 06/10/15  2:46 AM  Result Value Ref Range   Cholesterol 199 0 - 200 mg/dL   Triglycerides 44 <150 mg/dL   HDL 66 >40 mg/dL   Total CHOL/HDL Ratio 3.0 RATIO   VLDL 9 0 - 40 mg/dL   LDL Cholesterol 124 (H) 0 - 99 mg/dL    Comment:        Total Cholesterol/HDL:CHD Risk Coronary Heart Disease Risk Table                     Men   Women  1/2 Average Risk   3.4   3.3  Average Risk        5.0   4.4  2 X Average Risk   9.6   7.1  3 X Average Risk  23.4   11.0        Use the calculated Patient Ratio above and the CHD Risk Table to determine the patient's CHD Risk.        ATP III CLASSIFICATION (LDL):  <100     mg/dL   Optimal  100-129  mg/dL   Near or Above  Optimal  130-159  mg/dL   Borderline  160-189  mg/dL   High  >190     mg/dL   Very High   Hemoglobin A1c     Status: None   Collection Time: 06/10/15  2:46 AM  Result Value Ref Range   Hgb A1c MFr Bld 5.3 4.0 - 6.0 %  CBC     Status: Abnormal   Collection Time: 06/10/15  2:46 AM  Result Value Ref Range   WBC 9.9 3.8 - 10.6 K/uL   RBC 4.54 4.40 - 5.90 MIL/uL   Hemoglobin 14.1 13.0 - 18.0 g/dL   HCT 41.2 40.0 - 52.0 %   MCV 90.7 80.0 - 100.0 fL   MCH 31.1 26.0 - 34.0 pg   MCHC 34.3 32.0 - 36.0 g/dL   RDW 14.6 (H) 11.5 - 14.5 %   Platelets 325 150 - 440 K/uL  Basic metabolic panel     Status: None   Collection Time: 06/10/15  2:46 AM  Result Value Ref Range   Sodium 141 135 - 145 mmol/L   Potassium 3.7 3.5 - 5.1 mmol/L   Chloride 111 101 - 111 mmol/L   CO2 24 22 - 32 mmol/L   Glucose, Bld 85 65 - 99 mg/dL   BUN 15 6 - 20 mg/dL   Creatinine, Ser 0.98 0.61 - 1.24 mg/dL   Calcium 9.0 8.9 - 10.3 mg/dL   GFR calc non Af Amer >60 >60 mL/min   GFR calc Af Amer >60 >60 mL/min    Comment: (NOTE) The eGFR has been calculated using the CKD EPI equation. This calculation has not been validated in all clinical situations. eGFR's persistently <60 mL/min signify possible Chronic Kidney Disease.    Anion gap 6 5 - 15  Troponin I (q 6hr x 3)     Status: None   Collection Time: 06/10/15 10:21 AM  Result Value Ref Range   Troponin I <0.03 <0.031 ng/mL    Comment:        NO INDICATION OF MYOCARDIAL INJURY.     Ct Angio Head W/cm &/or Wo Cm  06/10/2015  CLINICAL DATA:  Left pontine stroke EXAM: CT ANGIOGRAPHY HEAD AND NECK TECHNIQUE: Multidetector CT imaging of the head and neck was  performed using the standard protocol during bolus administration of intravenous contrast. Multiplanar CT image reconstructions and MIPs were obtained to evaluate the vascular anatomy. Carotid stenosis measurements (when applicable) are obtained utilizing NASCET criteria, using the distal internal carotid diameter as the denominator. CONTRAST:  62mL OMNIPAQUE IOHEXOL 350 MG/ML SOLN COMPARISON:  MRI 06/09/2015 FINDINGS: CT HEAD Brain: Acute infarct left pons best seen on MRI. No hemorrhage. No other area of infarction. Ventricle size is normal. Calvarium and skull base: Negative Paranasal sinuses: Mucosal edema in the ethmoid sinuses bilaterally. No air-fluid level. Orbits: Negative CTA NECK Aortic arch: Normal aortic arch. Proximal great vessels widely patent. Lung apices clear. Right carotid system: Normal right carotid. No dissection or stenosis Left carotid system: Normal left carotid. No dissection or stenosis. Vertebral arteries:Both vertebral arteries widely patent. Right vertebral artery dominant. No dissection or stenosis in the vertebral arteries. Skeleton: No acute skeletal abnormality. Other neck: negative CTA HEAD Anterior circulation: Cavernous carotid widely patent bilaterally. No evidence of atherosclerotic disease stenosis or aneurysm. Anterior and middle cerebral arteries widely patent bilaterally Posterior circulation: Both vertebral arteries patent to the basilar without stenosis. PICA patent bilaterally. Basilar widely patent without stenosis or thrombus. Superior cerebellar and posterior cerebral arteries  patent bilaterally. Fetal origin right posterior cerebral artery with hypoplastic right P1 segment, a normal variant Venous sinuses: Patent Anatomic variants: None Delayed phase: Normal enhancement on delayed imaging. IMPRESSION: Acute infarct left pons, best seen on MRI. Normal CTA of the head and neck. Negative for atherosclerotic disease, dissection or thrombosis. Electronically Signed   By:  Franchot Gallo M.D.   On: 06/10/2015 15:34   Ct Head Wo Contrast  06/09/2015  CLINICAL DATA:  Seven onset of slurred speech. Mild right-sided facial droop EXAM: CT HEAD WITHOUT CONTRAST TECHNIQUE: Contiguous axial images were obtained from the base of the skull through the vertex without intravenous contrast. COMPARISON:  May 01, 2015 FINDINGS: The ventricles are normal in size and configuration. There is no intracranial mass, hemorrhage, extra-axial fluid collection, or midline shift. The gray-white compartments are normal. No acute infarct evident. The bony calvarium appears intact. The mastoid air cells are clear. There is ethmoid sinus disease bilaterally. There is also right sphenoid sinus disease. There is evidence of an old fracture of the right anterior nasal bone. IMPRESSION: Paranasal sinus disease. Old fracture right nasal bone. No intracranial mass, hemorrhage, or focal gray -white compartment lesions/acute appearing infarct. Electronically Signed   By: Lowella Grip III M.D.   On: 06/09/2015 11:06   Ct Angio Neck W/cm &/or Wo/cm  06/10/2015  CLINICAL DATA:  Left pontine stroke EXAM: CT ANGIOGRAPHY HEAD AND NECK TECHNIQUE: Multidetector CT imaging of the head and neck was performed using the standard protocol during bolus administration of intravenous contrast. Multiplanar CT image reconstructions and MIPs were obtained to evaluate the vascular anatomy. Carotid stenosis measurements (when applicable) are obtained utilizing NASCET criteria, using the distal internal carotid diameter as the denominator. CONTRAST:  47mL OMNIPAQUE IOHEXOL 350 MG/ML SOLN COMPARISON:  MRI 06/09/2015 FINDINGS: CT HEAD Brain: Acute infarct left pons best seen on MRI. No hemorrhage. No other area of infarction. Ventricle size is normal. Calvarium and skull base: Negative Paranasal sinuses: Mucosal edema in the ethmoid sinuses bilaterally. No air-fluid level. Orbits: Negative CTA NECK Aortic arch: Normal aortic  arch. Proximal great vessels widely patent. Lung apices clear. Right carotid system: Normal right carotid. No dissection or stenosis Left carotid system: Normal left carotid. No dissection or stenosis. Vertebral arteries:Both vertebral arteries widely patent. Right vertebral artery dominant. No dissection or stenosis in the vertebral arteries. Skeleton: No acute skeletal abnormality. Other neck: negative CTA HEAD Anterior circulation: Cavernous carotid widely patent bilaterally. No evidence of atherosclerotic disease stenosis or aneurysm. Anterior and middle cerebral arteries widely patent bilaterally Posterior circulation: Both vertebral arteries patent to the basilar without stenosis. PICA patent bilaterally. Basilar widely patent without stenosis or thrombus. Superior cerebellar and posterior cerebral arteries patent bilaterally. Fetal origin right posterior cerebral artery with hypoplastic right P1 segment, a normal variant Venous sinuses: Patent Anatomic variants: None Delayed phase: Normal enhancement on delayed imaging. IMPRESSION: Acute infarct left pons, best seen on MRI. Normal CTA of the head and neck. Negative for atherosclerotic disease, dissection or thrombosis. Electronically Signed   By: Franchot Gallo M.D.   On: 06/10/2015 15:34   Mr Jeri Cos ZO Contrast  06/09/2015  CLINICAL DATA:  Slurred speech with right-sided weakness. EXAM: MRI HEAD WITHOUT AND WITH CONTRAST TECHNIQUE: Multiplanar, multiecho pulse sequences of the brain and surrounding structures were obtained without and with intravenous contrast. CONTRAST:  62mL MULTIHANCE GADOBENATE DIMEGLUMINE 529 MG/ML IV SOLN COMPARISON:  Head CT from earlier today FINDINGS: Calvarium and upper cervical spine: No focal marrow signal abnormality.  Orbits: No significant findings. Sinuses and Mastoids: Sinusitis with mucosal thickening greatest and posterior right ethmoid air cell. Brain: There is restricted diffusion in the left pons consistent with  acute infarct. More inferiorly, there is gliosis in the right pons consistent with remote infarct. No other ischemic changes in the remainder of the brain. Normal flow related signal loss in the visualized vertebral and basilar arteries. No abnormal intracranial enhancement. No hemorrhage, hydrocephalus, or mass lesion. IMPRESSION: 1. Acute left pontine infarction. 2. Remote right pontine infarct. 3. No ischemic injury outside of the pons. Electronically Signed   By: Monte Fantasia M.D.   On: 06/09/2015 14:43   US Carotid Bilateral  06/09/2015  CLINICAL DATA:  Stroke, trouble swallowing, difficulty walking, speech disturbance. History of smoking. EXAM: BILATERAL CAROTID DUPLEX ULTRASOUND TECHNIQUE: Pearline Cables scale imaging, color Doppler and duplex ultrasound were performed of bilateral carotid and vertebral arteries in the neck. COMPARISON:  Brain MRI -earlier same day; head CT -earlier same day FINDINGS: Criteria: Quantification of carotid stenosis is based on velocity parameters that correlate the residual internal carotid diameter with NASCET-based stenosis levels, using the diameter of the distal internal carotid lumen as the denominator for stenosis measurement. The following velocity measurements were obtained: RIGHT ICA:  115/36 cm/sec CCA:  025/85 cm/sec SYSTOLIC ICA/CCA RATIO:  0.7 DIASTOLIC ICA/CCA RATIO:  0.7 ECA:  129 cm/sec LEFT ICA:  117/39 cm/sec CCA:  277/82 cm/sec SYSTOLIC ICA/CCA RATIO:  0.7 DIASTOLIC ICA/CCA RATIO:  1.1 ECA:  123 cm/sec RIGHT CAROTID ARTERY: There is a minimal amount of eccentric mixed echogenic plaque within the right carotid bulb (images 16 and 17), extending to involve the origin and proximal aspect of the right internal carotid artery (image 24, not resulting in elevated peak systolic velocities within the interrogated course of the right internal carotid artery to suggest a hemodynamically significant stenosis. RIGHT VERTEBRAL ARTERY:  Antegrade flow LEFT CAROTID ARTERY:  There is no grayscale evidence of significant intimal thickening or atherosclerotic plaque affecting the interrogated portions of the left carotid system. There are no elevated peak systolic velocities within the interrogated course of the left internal carotid artery to suggest a hemodynamically significant stenosis. LEFT VERTEBRAL ARTERY:  Antegrade flow IMPRESSION: 1. Very minimal amount of right-sided atherosclerotic plaque, not resulting in a hemodynamically significant stenosis. 2. Normal sonographic evaluation of the left carotid system. Electronically Signed   By: Sandi Mariscal M.D.   On: 06/09/2015 16:43   Dg Chest Portable 1 View  06/09/2015  CLINICAL DATA:  Nausea and chills ; altered mental status EXAM: PORTABLE CHEST 1 VIEW COMPARISON:  May 01, 2015 FINDINGS: The lungs are clear. The heart size and pulmonary vascularity are normal. No adenopathy. No bone lesions. IMPRESSION: No edema or consolidation. Electronically Signed   By: Lowella Grip III M.D.   On: 06/09/2015 10:53    Review of Systems  Constitutional: Positive for malaise/fatigue.  Eyes: Negative.   Respiratory: Negative.   Cardiovascular: Negative.   Gastrointestinal: Negative.   Genitourinary: Negative.   Musculoskeletal: Positive for myalgias.  Skin: Negative.   Neurological: Positive for tingling, sensory change, focal weakness, weakness and headaches.  Endo/Heme/Allergies: Negative.   Psychiatric/Behavioral: Negative.    Blood pressure 130/66, pulse 62, temperature 98.1 F (36.7 C), temperature source Oral, resp. rate 18, height $RemoveBe'5\' 8"'[id number]$  (1.727 m), weight 76.295 kg (168 lb 3.2 oz), SpO2 97 %. Physical Exam  Nursing note and vitals reviewed. Constitutional: He appears well-developed and well-nourished. He appears lethargic.  HENT:  Head: Normocephalic  and atraumatic.  Eyes: Conjunctivae are normal. Pupils are equal, round, and reactive to light.  Neck: Normal range of motion. Neck supple.  Cardiovascular:  Normal rate and regular rhythm.   Respiratory: Effort normal and breath sounds normal.  GI: Soft. Bowel sounds are normal.  Musculoskeletal: Normal range of motion.  Neurological: He appears lethargic. A sensory deficit is present. He exhibits abnormal muscle tone. Coordination and gait abnormal.  Skin: Skin is warm and dry.  Psychiatric: He has a normal mood and affect.    Assessment/Plan: CVA Smoking Hyperlipidemia Focal weakness . PLAN ASA long term for ascvd cONSIDER pLAVIX $RemoveBeforeD'75MG'[id number]$  QD Consider TEE for thrombus Agree with neurology input PT/OT for rehab Advice to quit smoking Continue lipid therapy   Leahanna Buser D. 06/10/2015, 10:00 PM

## 2015-06-10 NOTE — Plan of Care (Signed)
Problem: Discharge/Transitional Outcomes Goal: Other Discharge Outcomes/Goals Plan of care progress to goal: - No complaints of pain. - NIH= 3,  - Urinal at bedside. Will continue to monitor.

## 2015-06-10 NOTE — Consult Note (Signed)
CC: speech abnormality and R side weakness.    HPI: Steven Mclean is an 39 y.o. male with no significant medical history outside of smoking presents to the emergency room with slurred speech, shaking, right arm weakness and altered mental status which has improved.  He reports that he had similar symptoms about 1 month ago but at that time the weakness was on his left side. 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   History reviewed. No pertinent past medical history.  Past Surgical History  Procedure Laterality Date  . Dental surgery      Family History  Problem Relation Age of Onset  . Deep vein thrombosis Mother   . Stroke Other     Social History:  reports that he has been smoking Cigarettes.  He has been smoking about 1.00 pack per day. He does not have any smokeless tobacco history on file. He reports that he does not drink alcohol or use illicit drugs.  No Known Allergies  Medications: I have reviewed the patient's current medications.  ROS: History obtained from the patient  General ROS: negative for - chills, fatigue, fever, night sweats, weight gain or weight loss Psychological ROS: negative for - behavioral disorder, hallucinations, memory difficulties, mood swings or suicidal ideation Ophthalmic ROS: negative for - blurry vision, double vision, eye pain or loss of vision ENT ROS: negative for - epistaxis, nasal discharge, oral lesions, sore throat, tinnitus or vertigo Allergy and Immunology ROS: negative for - hives or itchy/watery eyes Hematological and Lymphatic ROS: negative for - bleeding problems, bruising or swollen lymph nodes Endocrine ROS: negative for - galactorrhea, hair pattern changes, polydipsia/polyuria or temperature intolerance Respiratory ROS: negative for - cough, hemoptysis, shortness of breath or wheezing Cardiovascular ROS: negative for - chest pain, dyspnea on exertion, edema or irregular  heartbeat Gastrointestinal ROS: negative for - abdominal pain, diarrhea, hematemesis, nausea/vomiting or stool incontinence Genito-Urinary ROS: negative for - dysuria, hematuria, incontinence or urinary frequency/urgency Musculoskeletal ROS: negative for - joint swelling or muscular weakness Neurological ROS: as noted in HPI Dermatological ROS: negative for rash and skin lesion changes  Physical Examination: Blood pressure 114/58, pulse 56, temperature 97.9 F (36.6 C), temperature source Oral, resp. rate 18, height  (1.727 m), weight 76.295 kg (168 lb 3.2 oz), SpO2 99 %.   Neurological Examination Mental Status: Alert, oriented, thought content appropriate.  Speech fluent without evidence of aphasia.  Able to follow 3 step commands without difficulty. Cranial Nerves: II: Discs flat bilaterally; Visual fields grossly normal, pupils equal, round, reactive to light and accommodation III,IV, VI: ptosis not present, extra-ocular motions intact bilaterally V,VII: smile symmetric, facial light touch sensation normal bilaterally VIII: hearing normal bilaterally IX,X: gag reflex present XI: bilateral shoulder shrug XII: midline tongue extension Motor: Right : Upper extremity   5/5    Left:     Upper extremity   5/5  Lower extremity   5/5     Lower extremity   5/5 Tone and bulk:normal tone throughout; no atrophy noted Sensory: Pinprick and light touch intact throughout, bilaterally Deep Tendon Reflexes: 1+ and symmetric throughout Plantars: Right: downgoing   Left: downgoing Cerebellar: normal finger-to-nose, normal rapid alternating movements and normal heel-to-shin test Gait: not tested.       Laboratory Studies:   Basic Metabolic Panel:  Recent Labs Lab 06/09/15 1026 06/10/15 0246  NA 142 141  K 4.3 3.7  CL 112* 111  CO2 24 24  GLUCOSE 107* 85  BUN 13 15  CREATININE 0.84 0.98  CALCIUM 9.4 9.0    Liver Function Tests:  Recent Labs Lab 06/09/15 1026  AST 25   ALT 40  ALKPHOS 79  BILITOT 0.7  PROT 7.1  ALBUMIN 4.0   No results for input(s): LIPASE, AMYLASE in the last 168 hours. No results for input(s): AMMONIA in the last 168 hours.  CBC:  Recent Labs Lab 06/09/15 1026 06/10/15 0246  WBC 11.5* 9.9  NEUTROABS 8.4*  --   HGB 14.4 14.1  HCT 43.6 41.2  MCV 90.6 90.7  PLT 384 325    Cardiac Enzymes:  Recent Labs Lab 06/09/15 1026 06/09/15 2132 06/10/15 0246 06/10/15 1021  TROPONINI <0.03 <0.03 <0.03 <0.03    BNP: Invalid input(s): POCBNP  CBG: No results for input(s): GLUCAP in the last 168 hours.  Microbiology: Results for orders placed or performed during the hospital encounter of 06/09/15  Culture, blood (routine x 2)     Status: None (Preliminary result)   Collection Time: 06/09/15  3:46 PM  Result Value Ref Range Status   Specimen Description BLOOD RIGHT FOREARM  Final   Special Requests BOTTLES DRAWN AEROBIC AND ANAEROBIC  3CC  Final   Culture NO GROWTH < 24 HOURS  Final   Report Status PENDING  Incomplete  Culture, blood (routine x 2)     Status: None (Preliminary result)   Collection Time: 06/09/15  3:47 PM  Result Value Ref Range Status   Specimen Description BLOOD LEFT FOREARM  Final   Special Requests BOTTLES DRAWN AEROBIC AND ANAEROBIC  6CC  Final   Culture NO GROWTH < 24 HOURS  Final   Report Status PENDING  Incomplete    Coagulation Studies: No results for input(s): LABPROT, INR in the last 72 hours.  Urinalysis:  Recent Labs Lab 06/09/15 1158  COLORURINE YELLOW*  LABSPEC 1.019  PHURINE 7.0  GLUCOSEU NEGATIVE  HGBUR NEGATIVE  BILIRUBINUR NEGATIVE  KETONESUR NEGATIVE  PROTEINUR NEGATIVE  NITRITE NEGATIVE  LEUKOCYTESUR NEGATIVE    Lipid Panel:     Component Value Date/Time   CHOL 199 06/10/2015 0246   TRIG 44 06/10/2015 0246   HDL 66 06/10/2015 0246   CHOLHDL 3.0 06/10/2015 0246   VLDL 9 06/10/2015 0246   LDLCALC 124* 06/10/2015 0246    HgbA1C: No results found for:  HGBA1C  Urine Drug Screen:     Component Value Date/Time   LABOPIA NONE DETECTED 06/09/2015 1158   LABBENZ NONE DETECTED 06/09/2015 1158   AMPHETMU NONE DETECTED 06/09/2015 1158   THCU POSITIVE* 06/09/2015 1158   LABBARB NONE DETECTED 06/09/2015 1158    Alcohol Level:  Recent Labs Lab 06/09/15 1026  ETH <5      Imaging: Ct Head Wo Contrast  06/09/2015  CLINICAL DATA:  Seven onset of slurred speech. Mild right-sided facial droop EXAM: CT HEAD WITHOUT CONTRAST TECHNIQUE: Contiguous axial images were obtained from the base of the skull through the vertex without intravenous contrast. COMPARISON:  May 01, 2015 FINDINGS: The ventricles are normal in size and configuration. There is no intracranial mass, hemorrhage, extra-axial fluid collection, or midline shift. The gray-white compartments are normal. No acute infarct evident. The bony calvarium appears intact. The mastoid air cells are clear. There is ethmoid sinus disease bilaterally. There is also right sphenoid sinus disease. There is evidence of an old fracture of the right anterior nasal bone. IMPRESSION: Paranasal sinus disease. Old fracture right nasal bone. No intracranial mass, hemorrhage, or  focal gray -white compartment lesions/acute appearing infarct. Electronically Signed   By: Bretta Bang III M.D.   On: 06/09/2015 11:06   Mr Laqueta Jean RU Contrast  06/09/2015  CLINICAL DATA:  Slurred speech with right-sided weakness. EXAM: MRI HEAD WITHOUT AND WITH CONTRAST TECHNIQUE: Multiplanar, multiecho pulse sequences of the brain and surrounding structures were obtained without and with intravenous contrast. CONTRAST:  15mL MULTIHANCE GADOBENATE DIMEGLUMINE 529 MG/ML IV SOLN COMPARISON:  Head CT from earlier today FINDINGS: Calvarium and upper cervical spine: No focal marrow signal abnormality. Orbits: No significant findings. Sinuses and Mastoids: Sinusitis with mucosal thickening greatest and posterior right ethmoid air cell.  Brain: There is restricted diffusion in the left pons consistent with acute infarct. More inferiorly, there is gliosis in the right pons consistent with remote infarct. No other ischemic changes in the remainder of the brain. Normal flow related signal loss in the visualized vertebral and basilar arteries. No abnormal intracranial enhancement. No hemorrhage, hydrocephalus, or mass lesion. IMPRESSION: 1. Acute left pontine infarction. 2. Remote right pontine infarct. 3. No ischemic injury outside of the pons. Electronically Signed   By: Marnee Spring M.D.   On: 06/09/2015 14:43   US Carotid Bilateral  06/09/2015  CLINICAL DATA:  Stroke, trouble swallowing, difficulty walking, speech disturbance. History of smoking. EXAM: BILATERAL CAROTID DUPLEX ULTRASOUND TECHNIQUE: Wallace Cullens scale imaging, color Doppler and duplex ultrasound were performed of bilateral carotid and vertebral arteries in the neck. COMPARISON:  Brain MRI -earlier same day; head CT -earlier same day FINDINGS: Criteria: Quantification of carotid stenosis is based on velocity parameters that correlate the residual internal carotid diameter with NASCET-based stenosis levels, using the diameter of the distal internal carotid lumen as the denominator for stenosis measurement. The following velocity measurements were obtained: RIGHT ICA:  115/36 cm/sec CCA:  155/37 cm/sec SYSTOLIC ICA/CCA RATIO:  0.7 DIASTOLIC ICA/CCA RATIO:  0.7 ECA:  129 cm/sec LEFT ICA:  117/39 cm/sec CCA:  163/37 cm/sec SYSTOLIC ICA/CCA RATIO:  0.7 DIASTOLIC ICA/CCA RATIO:  1.1 ECA:  123 cm/sec RIGHT CAROTID ARTERY: There is a minimal amount of eccentric mixed echogenic plaque within the right carotid bulb (images 16 and 17), extending to involve the origin and proximal aspect of the right internal carotid artery (image 24, not resulting in elevated peak systolic velocities within the interrogated course of the right internal carotid artery to suggest a hemodynamically significant  stenosis. RIGHT VERTEBRAL ARTERY:  Antegrade flow LEFT CAROTID ARTERY: There is no grayscale evidence of significant intimal thickening or atherosclerotic plaque affecting the interrogated portions of the left carotid system. There are no elevated peak systolic velocities within the interrogated course of the left internal carotid artery to suggest a hemodynamically significant stenosis. LEFT VERTEBRAL ARTERY:  Antegrade flow IMPRESSION: 1. Very minimal amount of right-sided atherosclerotic plaque, not resulting in a hemodynamically significant stenosis. 2. Normal sonographic evaluation of the left carotid system. Electronically Signed   By: Simonne Come M.D.   On: 06/09/2015 16:43   Dg Chest Portable 1 View  06/09/2015  CLINICAL DATA:  Nausea and chills ; altered mental status EXAM: PORTABLE CHEST 1 VIEW COMPARISON:  May 01, 2015 FINDINGS: The lungs are clear. The heart size and pulmonary vascularity are normal. No adenopathy. No bone lesions. IMPRESSION: No edema or consolidation. Electronically Signed   By: Bretta Bang III M.D.   On: 06/09/2015 10:53     Assessment/Plan:  39 y.o. male with no significant medical history outside of smoking presents to the emergency room  with slurred speech, shaking, right arm weakness and altered mental status which has improved.  He reports that he had similar symptoms about 1 month ago but at that time the weakness was on his left side. 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    Risk factors for stroke include smoking   - This is posterior circulation stroke CTA head and neck ordered  - hypercoagulable profile ordered  - con't daily ASA - if no abnormalities found on the above studies will need out pt holter or preferably LINQ monitor that is set up by EP cardiology.   - Appreciate PT evaluation, needs home PT - will send urine drug screen.  Pauletta BrownsZEYLIKMAN, Steven Mclean   06/10/2015, 1:19 PM

## 2015-06-10 NOTE — Evaluation (Signed)
Clinical/Bedside Swallow Evaluation Patient Details  Name: Steven Mclean MRN: [id number] Date of Birth: [birth year]  Today's Date: 06/10/2015 Time: SLP Start Time (ACUTE ONLY): 0935 SLP Stop Time (ACUTE ONLY): 1035 SLP Time Calculation (min) (ACUTE ONLY): 60 min  Past Medical History: History reviewed. No pertinent past medical history. Past Surgical History:  Past Surgical History  Procedure Laterality Date  . Dental surgery     HPI:  Steven Mclean is a 39 y.o. male with no significant medical history presents to the emergency room with slurred speech, shaking, right arm weakness and altered mental status. He reports that he had similar symptoms about 1 month ago but at that time the weakness was on his left side. Those symptoms resolved. His current symptoms started last night and have gotten progressively worse. He has had shaking, sweats, chills. No nausea vomiting or diarrhea. No chest pain or palpitations. Family has noticed significant change in his speech and right-sided facial droop with some disorientation. They also state that since his last evaluation one month ago he has had difficulty swallowing with choking on water. 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    Assessment / Plan / Recommendation Clinical Impression  Pt presents w/no apparent oropharyngeal dysphagia, but s/s of esophageal dysphagia. Pt demonstrated no overt s/s of aspiration w/any tested consistency. Laryngeal elevation appeared adequate and vocal quality remained clear throughout trials. Pt reported that following all boluses except for puree that the bolus did not feel as if it cleared his lower throat. Pt reports no pain but that boluses felt "stuck" and that he felt as if he could still taste them. Pt stated that these sypmtoms have been present over the last month and that he is still eating and drinking but that it feels  uncomfortable. Oral phase appeared [facility]. Pt able to adequately masticate and clear solid consistency. No pocketing or holding was observed.  Oral mech exam revealed oral motor abilities to be [facility]. Per chart review pt was initially presenting with right facial droop and weakness, but this appears to have resolved. Pt conversed appropriately with ST, expressive and receptive language skills appear intact in conversation. Recommend Dysphagia III diet w/thin liquids and aspiration precations.  Discussed swallow eval and concerns of esophageal dysphagia with MD. Will f/u in 1-3 days re: toleration of diet.     Aspiration Risk  Mild    Diet Recommendation Dysphagia 3 (Mech soft);Thin   Medication Administration: Whole meds with puree Compensations: Slow rate;Small sips/bites;Follow solids with liquid    Other  Recommendations Recommended Consults: Consider esophageal assessment;Consider GI evaluation Oral Care Recommendations: Oral care BID;Patient independent with oral care   Follow Up Recommendations       Frequency and Duration min 2x/week  1 week   Pertinent Vitals/Pain No denies    SLP Swallow Goals     Swallow Study Prior Functional Status    Pt reports he was eating a regular diet at home, but over the last month had been experiencing some dysphagia symptoms.     General Date of Onset: 06/09/15 Other Pertinent Information: Steven Mclean is a 39 y.o. male with no significant medical history presents to the emergency room with slurred speech, shaking, right arm weakness and altered mental status. He reports that he had similar symptoms about 1 month ago but at that time the weakness was on his left side. Those symptoms resolved. His current symptoms  started last night and have gotten progressively worse. He has had shaking, sweats, chills. No nausea vomiting or diarrhea. No chest pain or palpitations. Family has noticed significant change in his speech and right-sided facial droop with  some disorientation. They also state that since his last evaluation one month ago he has had difficulty swallowing with choking on water. 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  Type of Study: Bedside swallow evaluation Previous Swallow Assessment: None reported Diet Prior to this Study: NPO Temperature Spikes Noted: N/A Respiratory Status: Room air History of Recent Intubation: No Behavior/Cognition: Alert;Cooperative Oral Cavity - Dentition: Adequate natural dentition/normal for age Self-Feeding Abilities: Able to feed self Patient Positioning: Upright in bed Baseline Vocal Quality: Normal Volitional Cough: Strong Volitional Swallow: Able to elicit    Oral/Motor/Sensory Function Overall Oral Motor/Sensory Function: Appears within functional limits for tasks assessed Labial ROM: Within Functional Limits Labial Symmetry: Within Functional Limits Labial Strength: Within Functional Limits Labial Sensation: Within Functional Limits Lingual ROM: Within Functional Limits Lingual Symmetry: Within Functional Limits Lingual Strength: Within Functional Limits Lingual Sensation: Within Functional Limits Facial ROM: Within Functional Limits Facial Symmetry: Within Functional Limits Facial Strength: Within Functional Limits Facial Sensation: Within Functional Limits Velum: Within Functional Limits Mandible: Within Functional Limits   Ice Chips Ice chips: Not tested   Thin Liquid Thin Liquid: Within functional limits Presentation: Cup;Straw;Self Fed Other Comments: No overt s/s of aspiration with 6 ounces of thin liquids via cup and straw. Pt c/o sensation that boluses were not going down (indcated bottom of neck).    Nectar Thick Nectar Thick Liquid: Not tested   Honey Thick Honey Thick Liquid: Not tested   Puree Puree: Within functional limits Presentation: Self Fed;Spoon Other Comments: No overt s/s  of aspiration w/4 ounces of puree.   Solid   GO    Solid: Within functional limits Presentation: Self Fed Other Comments: No overt s/s of aspiration w/bites of solid consistency. Pt able to masticate well.        [place],Hussam Muniz 06/10/2015,10:38 AM

## 2015-06-11 LAB — CBC
HEMATOCRIT: 40.5 % (ref 40.0–52.0)
HEMOGLOBIN: 14.1 g/dL (ref 13.0–18.0)
MCH: 31.2 pg (ref 26.0–34.0)
MCHC: 34.8 g/dL (ref 32.0–36.0)
MCV: 89.9 fL (ref 80.0–100.0)
Platelets: 318 10*3/uL (ref 150–440)
RBC: 4.51 MIL/uL (ref 4.40–5.90)
RDW: 14.6 % — AB (ref 11.5–14.5)
WBC: 8.4 10*3/uL (ref 3.8–10.6)

## 2015-06-11 LAB — URINE DRUG SCREEN, QUALITATIVE (ARMC ONLY)
AMPHETAMINES, UR SCREEN: NOT DETECTED
BARBITURATES, UR SCREEN: NOT DETECTED
BENZODIAZEPINE, UR SCRN: NOT DETECTED
COCAINE METABOLITE, UR ~~LOC~~: NOT DETECTED
Cannabinoid 50 Ng, Ur ~~LOC~~: POSITIVE — AB
MDMA (Ecstasy)Ur Screen: NOT DETECTED
Methadone Scn, Ur: NOT DETECTED
OPIATE, UR SCREEN: NOT DETECTED
PHENCYCLIDINE (PCP) UR S: NOT DETECTED
Tricyclic, Ur Screen: NOT DETECTED

## 2015-06-11 MED ORDER — ASPIRIN 81 MG PO CHEW
81.0000 mg | CHEWABLE_TABLET | Freq: Every day | ORAL | Status: DC
  Administered 2015-06-11 – 2015-06-13 (×3): 81 mg via ORAL
  Filled 2015-06-11 (×3): qty 1

## 2015-06-11 MED ORDER — FLUCONAZOLE 100 MG PO TABS
200.0000 mg | ORAL_TABLET | Freq: Every day | ORAL | Status: DC
  Administered 2015-06-12: 200 mg via ORAL
  Filled 2015-06-11: qty 2

## 2015-06-11 MED ORDER — CLOPIDOGREL BISULFATE 75 MG PO TABS
75.0000 mg | ORAL_TABLET | Freq: Every day | ORAL | Status: DC
  Administered 2015-06-11 – 2015-06-13 (×3): 75 mg via ORAL
  Filled 2015-06-11 (×3): qty 1

## 2015-06-11 MED ORDER — PANTOPRAZOLE SODIUM 40 MG PO TBEC
40.0000 mg | DELAYED_RELEASE_TABLET | Freq: Every day | ORAL | Status: DC
  Administered 2015-06-11 – 2015-06-13 (×3): 40 mg via ORAL
  Filled 2015-06-11 (×3): qty 1

## 2015-06-11 NOTE — Progress Notes (Signed)
Physical Therapy Treatment Patient Details Name: Steven Mclean MRN: [id number] DOB: 12/Steven Allegra17/1977 Today's Date: 06/11/2015    History of Present Illness Patient is a 39 y/o male that presents with R sided weakness and slurred speech. 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     PT Comments    Pt shows significant improvement with ambulation, stability and balance today and though he still feels some unsteadiness was able to ambulate safely w/o AD and did not need direct assist to maintain balance. Pt eager to do balance activities and generally shows good effort.  Answered questions of pt and family.  Follow Up Recommendations  Outpatient PT     Equipment Recommendations  None recommended by PT    Recommendations for Other Services       Precautions / Restrictions Precautions Precautions: Fall Restrictions Weight Bearing Restrictions: No    Mobility  Bed Mobility Overal bed mobility: Independent                Transfers Overall transfer level: Independent Equipment used: None             General transfer comment: Pt is able to maintain standing balance w/o AD and shows good confidence  Ambulation/Gait Ambulation/Gait assistance: Supervision Ambulation Distance (Feet): 450 Feet Assistive device: None       General Gait Details: Pt again with some veering, but is much less severe and not a safety concern like previous session.    Stairs            Wheelchair Mobility    Modified Rankin (Stroke Patients Only)       Balance                                    Cognition Arousal/Alertness: Awake/alert Behavior During Therapy: WFL for tasks assessed/performed;Impulsive Overall Cognitive Status: Within Functional Limits for tasks assessed                      Exercises Other Exercises Other Exercises: standing balance exercises - multiple bouts each: heel raises with and w/o HHA, SLS w/ and  w/o HHA, eyes closed NBOS static standing w/ and w/o perturbations, tandem ambulation fwd/back with minimal HHA for retro ambulation.    General Comments        Pertinent Vitals/Pain Pain Assessment: No/denies pain    Home Living                      Prior Function            PT Goals (current goals can now be found in the care plan section) Progress towards PT goals: Progressing toward goals    Frequency  7X/week    PT Plan Current plan remains appropriate    Co-evaluation             End of Session Equipment Utilized During Treatment: Gait belt Activity Tolerance: Patient tolerated treatment well Patient left: in bed;with call bell/phone within reach;with family/visitor present;with bed alarm set     Time: 1410-1438 PT Time Calculation (min) (ACUTE ONLY): 28 min  Charges:  $Gait Training: 8-22 mins $Neuromuscular Re-education: 8-22 mins                    G Codes:     Steven SentersGalen Taysia Mclean, PT, DPT [phone]  Steven ProGalen R Jaquin Mclean 06/11/2015, 5:29  PM   

## 2015-06-11 NOTE — Progress Notes (Signed)
[facility] - [place] at [facility]   PATIENT NAME: Steven Mclean    MR#:  [id number]  DATE OF BIRTH:  1976-04-12  SUBJECTIVE:  CHIEF COMPLAINT:  Patient is resting comfortably. Passed swallow evaluation. Reporting difficulty and sticking sensation while swallowing . Denies any headache.  REVIEW OF SYSTEMS:  CONSTITUTIONAL: No fever, fatigue or weakness.  EYES: No blurred or double vision.  EARS, NOSE, AND THROAT: No tinnitus or ear pain.  RESPIRATORY: No cough, shortness of breath, wheezing or hemoptysis.  CARDIOVASCULAR: No chest pain, orthopnea, edema.  GASTROINTESTINAL: No nausea, vomiting, diarrhea or abdominal pain. Reports dysphagia GENITOURINARY: No dysuria, hematuria.  ENDOCRINE: No polyuria, nocturia,  HEMATOLOGY: No anemia, easy bruising or bleeding SKIN: No rash or lesion. MUSCULOSKELETAL: No joint pain or arthritis.   NEUROLOGIC: No tingling, numbness, weakness.  PSYCHIATRY: No anxiety or depression.   DRUG ALLERGIES:  No Known Allergies  VITALS:  Blood pressure 131/71, pulse 56, temperature 98.6 F (37 C), temperature source Oral, resp. rate 18, height  (1.727 m), weight 76.295 kg (168 lb 3.2 oz), SpO2 100 %.  PHYSICAL EXAMINATION:  GENERAL:  39 y.o.-year-old patient lying in the bed with no acute distress.  EYES: Pupils equal, round, reactive to light and accommodation. No scleral icterus. Extraocular muscles intact.  HEENT: Head atraumatic, normocephalic. Oropharynx and nasopharynx clear.  NECK:  Supple, no jugular venous distention. No thyroid enlargement, no tenderness.  LUNGS: Normal breath sounds bilaterally, no wheezing, rales,rhonchi or crepitation. No use of accessory muscles of respiration.  CARDIOVASCULAR: S1, S2 normal. No murmurs, rubs, or gallops.  ABDOMEN: Soft, nontender, nondistended. Bowel sounds present. No organomegaly or mass.  EXTREMITIES: No pedal edema, cyanosis, or clubbing.  NEUROLOGIC: Cranial nerves II  through XII are intact. Muscle strength 5/5 in all extremities. Sensation intact. Gait not checked.  PSYCHIATRIC: The patient is alert and oriented x 3.  SKIN: No obvious rash, lesion, or ulcer. Multiple tattoos were noticed on the body   LABORATORY PANEL:   CBC  Recent Labs Lab 06/11/15 0544  WBC 8.4  HGB 14.1  HCT 40.5  PLT 318   ------------------------------------------------------------------------------------------------------------------  Chemistries   Recent Labs Lab 06/09/15 1026 06/10/15 0246  NA 142 141  K 4.3 3.7  CL 112* 111  CO2 24 24  GLUCOSE 107* 85  BUN 13 15  CREATININE 0.84 0.98  CALCIUM 9.4 9.0  AST 25  --   ALT 40  --   ALKPHOS 79  --   BILITOT 0.7  --    ------------------------------------------------------------------------------------------------------------------  Cardiac Enzymes  Recent Labs Lab 06/10/15 1021  TROPONINI <0.03   ------------------------------------------------------------------------------------------------------------------  RADIOLOGY:  Ct Angio Head W/cm &/or Wo Cm  06/10/2015  CLINICAL DATA:  Left pontine stroke EXAM: CT ANGIOGRAPHY HEAD AND NECK TECHNIQUE: Multidetector CT imaging of the head and neck was performed using the standard protocol during bolus administration of intravenous contrast. Multiplanar CT image reconstructions and MIPs were obtained to evaluate the vascular anatomy. Carotid stenosis measurements (when applicable) are obtained utilizing NASCET criteria, using the distal internal carotid diameter as the denominator. CONTRAST:  75mL OMNIPAQUE IOHEXOL 350 MG/ML SOLN COMPARISON:  MRI 06/09/2015 FINDINGS: CT HEAD Brain: Acute infarct left pons best seen on MRI. No hemorrhage. No other area of infarction. Ventricle size is normal. Calvarium and skull base: Negative Paranasal sinuses: Mucosal edema in the ethmoid sinuses bilaterally. No air-fluid level. Orbits: Negative CTA NECK Aortic arch: Normal aortic  arch. Proximal great vessels widely patent. Lung apices clear.  Right carotid system: Normal right carotid. No dissection or stenosis Left carotid system: Normal left carotid. No dissection or stenosis. Vertebral arteries:Both vertebral arteries widely patent. Right vertebral artery dominant. No dissection or stenosis in the vertebral arteries. Skeleton: No acute skeletal abnormality. Other neck: negative CTA HEAD Anterior circulation: Cavernous carotid widely patent bilaterally. No evidence of atherosclerotic disease stenosis or aneurysm. Anterior and middle cerebral arteries widely patent bilaterally Posterior circulation: Both vertebral arteries patent to the basilar without stenosis. PICA patent bilaterally. Basilar widely patent without stenosis or thrombus. Superior cerebellar and posterior cerebral arteries patent bilaterally. Fetal origin right posterior cerebral artery with hypoplastic right P1 segment, a normal variant Venous sinuses: Patent Anatomic variants: None Delayed phase: Normal enhancement on delayed imaging. IMPRESSION: Acute infarct left pons, best seen on MRI. Normal CTA of the head and neck. Negative for atherosclerotic disease, dissection or thrombosis. Electronically Signed   By: Marlan Palau M.D.   On: 06/10/2015 15:34   Ct Angio Neck W/cm &/or Wo/cm  06/10/2015  CLINICAL DATA:  Left pontine stroke EXAM: CT ANGIOGRAPHY HEAD AND NECK TECHNIQUE: Multidetector CT imaging of the head and neck was performed using the standard protocol during bolus administration of intravenous contrast. Multiplanar CT image reconstructions and MIPs were obtained to evaluate the vascular anatomy. Carotid stenosis measurements (when applicable) are obtained utilizing NASCET criteria, using the distal internal carotid diameter as the denominator. CONTRAST:  75mL OMNIPAQUE IOHEXOL 350 MG/ML SOLN COMPARISON:  MRI 06/09/2015 FINDINGS: CT HEAD Brain: Acute infarct left pons best seen on MRI. No hemorrhage. No  other area of infarction. Ventricle size is normal. Calvarium and skull base: Negative Paranasal sinuses: Mucosal edema in the ethmoid sinuses bilaterally. No air-fluid level. Orbits: Negative CTA NECK Aortic arch: Normal aortic arch. Proximal great vessels widely patent. Lung apices clear. Right carotid system: Normal right carotid. No dissection or stenosis Left carotid system: Normal left carotid. No dissection or stenosis. Vertebral arteries:Both vertebral arteries widely patent. Right vertebral artery dominant. No dissection or stenosis in the vertebral arteries. Skeleton: No acute skeletal abnormality. Other neck: negative CTA HEAD Anterior circulation: Cavernous carotid widely patent bilaterally. No evidence of atherosclerotic disease stenosis or aneurysm. Anterior and middle cerebral arteries widely patent bilaterally Posterior circulation: Both vertebral arteries patent to the basilar without stenosis. PICA patent bilaterally. Basilar widely patent without stenosis or thrombus. Superior cerebellar and posterior cerebral arteries patent bilaterally. Fetal origin right posterior cerebral artery with hypoplastic right P1 segment, a normal variant Venous sinuses: Patent Anatomic variants: None Delayed phase: Normal enhancement on delayed imaging. IMPRESSION: Acute infarct left pons, best seen on MRI. Normal CTA of the head and neck. Negative for atherosclerotic disease, dissection or thrombosis. Electronically Signed   By: Marlan Palau M.D.   On: 06/10/2015 15:34    EKG:   Orders placed or performed during the hospital encounter of 06/09/15  . ED EKG  . ED EKG  . EKG 12-Lead  . 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 Cardio plannning TEE tomorrow carotid Dopplers normal  blood  cultures NTD . Patient is afebrile. Will start him on IV antibiotics if blood cultures are positive hypercoagulability panel pending PT  recommending outpatient physical therapy Appreciate neurology consult for recurrent strokes in this young  patient. Recs op holter or LINQ   He is also had recent chills and rigors with negative UA and chest x-ray suggesting possible endocarditis.  Denies IV drug use.  #2 subjective fever with chills Chest x-rays negative, UA with yeast, start diflucan, blood cultures are NTD. No cardiac murmur noticed Echocardiogram is pending TEE is ordered as there is a concern for embolic stroke Concern for endocarditis  but patient denies any IV drug use. Not febrile. Blood cultures are pending. No cardia murmur noticed. No leukocytosis either. Will consider IV antibiotics if cultures are positive or patient is febrile   #3 dysphagia for few weeeeks PPI and Barium swallow in am Appreciate  GI  recommendations   #4 tobacco abuse   counseled patient to quit smoking for 3-5 minutes. Agreeable. We'll start him on nicotine patch.      All the records are reviewed and case discussed with Care Management/Social Workerr. Management plans discussed with the patient, family and they are in agreement.  CODE STATUS: full   TOTAL TIME TAKING CARE OF THIS PATIENT: 35  minutes.   POSSIBLE D/C IN 2-3  DAYS, DEPENDING ON CLINICAL CONDITION.   Ramonita LabGouru, Jammy Stlouis M.D on 06/11/2015 at 10:27 PM  Between 7am to 6pm - Pager - [phone] After 6pm go to [email or web address] - password [email or web address] [facility]  [facility]  Office  [phone]  CC: Primary care physician; No PCP Per Patient

## 2015-06-11 NOTE — Plan of Care (Signed)
Problem: Discharge/Transitional Outcomes Goal: Other Discharge Outcomes/Goals Outcome: Progressing Pt is alert and oriented, no c/o pain at this time. Left sided droop remains present, independent in room, encouraged to call for assistance. NSR on telemetry, VSS.

## 2015-06-11 NOTE — Plan of Care (Signed)
Problem: Discharge/Transitional Outcomes Goal: Other Discharge Outcomes/Goals Outcome: Progressing VSS. NIH score 1. No neurological changes during the shift. Tolerates diet. C/o dysphagia. NPO after midnight for TEE/barium swallow. Denies pain.

## 2015-06-11 NOTE — Consult Note (Signed)
GI Inpatient Consult Note Christena Deem MD  Reason for Consult: Dysphagia   Attending Requesting Consult: Dr Elveria Royals  Outpatient Primary Physician: No primary care physician  History of Present Illness: Steven Mclean is a 39 y.o. male presented with a symptom of dysphagia. He has an interesting history in that he presented to the emergency room over this past Labor Day weekend with left-sided weakness and a tired feeling. He was sent home after evaluation which showed no acute problems. CT scan of the head was normal at that time and his neurologic exam was intact. He went back to work still felt that she and the weakness on his left side improved. In 2 days ago he had similar symptoms but this time on the right side of his body with a left eye irritation. He came back to the emergency room. MRI showed evidence of an acute left pontine stroke and a old right pontine stroke.  Prior to the Labor Day weekend episode he states he had no problems with nausea vomiting or abdominal pain. He does have symptomatic heartburn this occurring for over 5 years. There was no dysphagia. There is no history of peptic ulcer disease. He takes no aspirin or NSAIDs as an outpatient. Ever had an EGD done. Never had a colonoscopy done. His dysphagia is a bit worse now than it was after the first episode and seemed to be getting a little bit worse immediately before this hospitalization. Describes this as he is wanting to it in the back of his throat. Seems to be more of a cervical or pharyngeal sensation.Marland Kitchen He is not spitting saliva and is handling his secretions well. He had a SLP evaluation yesterday showed no overt signs or symptoms of aspiration with any tested consistency. Laryngeal elevation seemed adequate vocal quality remained clear however he reports that boluses of food do not seem to clear his lower throat. There is no pain. Mastication was normal.  Patient is currently on clopidogrel and 81 mg aspirin as well  as heparin.  Past Medical History:  History reviewed. No pertinent past medical history.  Problem List: Patient Active Problem List   Diagnosis Date Noted  . CVA (cerebral infarction) 06/09/2015    Past Surgical History: Past Surgical History  Procedure Laterality Date  . Dental surgery      Allergies: No Known Allergies  Home Medications: No prescriptions prior to admission   Home medication reconciliation was completed with the patient.   Scheduled Inpatient Medications:   . aspirin  324 mg Oral Once  . aspirin  81 mg Oral Daily  . clopidogrel  75 mg Oral Daily  . heparin  5,000 Units Subcutaneous 3 times per day  . Influenza vac split quadrivalent PF  0.5 mL Intramuscular Tomorrow-1000  . pantoprazole  40 mg Oral Daily  . pneumococcal 23 valent vaccine  0.5 mL Intramuscular Tomorrow-1000  . pravastatin  40 mg Oral QHS    Continuous Inpatient Infusions:     PRN Inpatient Medications:    Family History: family history includes Deep vein thrombosis in his mother; Stroke in his other.   GI Family History: Noncontributory  Social History:   reports that he has been smoking Cigarettes.  He has been smoking about 1.00 pack per day. He does not have any smokeless tobacco history on file. He reports that he does not drink alcohol or use illicit drugs. The patient denies ETOH, tobacco, or drug use.   ROS  Review of Systems:  10 systems reviewed per admission history and physical agree with same  Physical Examination: BP 123/77 mmHg  Pulse 51  Temp(Src) 97.8 F (36.6 C) (Oral)  Resp 20  Ht 5\' 8"  (1.727 m)  Wt 76.295 kg (168 lb 3.2 oz)  BMI 25.58 kg/m2  SpO2 100% Gen: Well-appearing 39 year old male no acute distress at time speech is uncoordinated or disjointed. HEENT: Normocephalic atraumatic eyes are anicteric. Gaze appears to be disconjugate at times however he reports no diplopia. Neck: Supple no JVD no lymphadenopathy no thyromegaly Chest: Clear to  auscultation bilateral CV: Regular rate and rhythm without rub or gallop Abd: Soft nontender nondistended bowel sounds positive and normoactive Ext: No clubbing cyanosis or edema Skin: Warm and dry Other: Screening visual field is normal extraocular muscle movement is normal  Data: Lab Results  Component Value Date   WBC 8.4 06/11/2015   HGB 14.1 06/11/2015   HCT 40.5 06/11/2015   MCV 89.9 06/11/2015   PLT 318 06/11/2015    Recent Labs Lab 06/09/15 1026 06/10/15 0246 06/11/15 0544  HGB 14.4 14.1 14.1   Lab Results  Component Value Date   NA 141 06/10/2015   K 3.7 06/10/2015   CL 111 06/10/2015   CO2 24 06/10/2015   BUN 15 06/10/2015   CREATININE 0.98 06/10/2015   Lab Results  Component Value Date   ALT 40 06/09/2015   AST 25 06/09/2015   ALKPHOS 79 06/09/2015   BILITOT 0.7 06/09/2015   No results for input(s): APTT, INR, PTT in the last 168 hours. CBC Latest Ref Rng 06/11/2015 06/10/2015 06/09/2015  WBC 3.8 - 10.6 K/uL 8.4 9.9 11.5(H)  Hemoglobin 13.0 - 18.0 g/dL 16.1 09.6 04.5  Hematocrit 40.0 - 52.0 % 40.5 41.2 43.6  Platelets 150 - 440 K/uL 318 325 384    STUDIES: Ct Angio Head W/cm &/or Wo Cm  06/10/2015  CLINICAL DATA:  Left pontine stroke EXAM: CT ANGIOGRAPHY HEAD AND NECK TECHNIQUE: Multidetector CT imaging of the head and neck was performed using the standard protocol during bolus administration of intravenous contrast. Multiplanar CT image reconstructions and MIPs were obtained to evaluate the vascular anatomy. Carotid stenosis measurements (when applicable) are obtained utilizing NASCET criteria, using the distal internal carotid diameter as the denominator. CONTRAST:  75mL OMNIPAQUE IOHEXOL 350 MG/ML SOLN COMPARISON:  MRI 06/09/2015 FINDINGS: CT HEAD Brain: Acute infarct left pons best seen on MRI. No hemorrhage. No other area of infarction. Ventricle size is normal. Calvarium and skull base: Negative Paranasal sinuses: Mucosal edema in the ethmoid  sinuses bilaterally. No air-fluid level. Orbits: Negative CTA NECK Aortic arch: Normal aortic arch. Proximal great vessels widely patent. Lung apices clear. Right carotid system: Normal right carotid. No dissection or stenosis Left carotid system: Normal left carotid. No dissection or stenosis. Vertebral arteries:Both vertebral arteries widely patent. Right vertebral artery dominant. No dissection or stenosis in the vertebral arteries. Skeleton: No acute skeletal abnormality. Other neck: negative CTA HEAD Anterior circulation: Cavernous carotid widely patent bilaterally. No evidence of atherosclerotic disease stenosis or aneurysm. Anterior and middle cerebral arteries widely patent bilaterally Posterior circulation: Both vertebral arteries patent to the basilar without stenosis. PICA patent bilaterally. Basilar widely patent without stenosis or thrombus. Superior cerebellar and posterior cerebral arteries patent bilaterally. Fetal origin right posterior cerebral artery with hypoplastic right P1 segment, a normal variant Venous sinuses: Patent Anatomic variants: None Delayed phase: Normal enhancement on delayed imaging. IMPRESSION: Acute infarct left pons, best seen on MRI. Normal CTA of the head and  neck. Negative for atherosclerotic disease, dissection or thrombosis. Electronically Signed   By: Marlan Palau M.D.   On: 06/10/2015 15:34   Ct Angio Neck W/cm &/or Wo/cm  06/10/2015  CLINICAL DATA:  Left pontine stroke EXAM: CT ANGIOGRAPHY HEAD AND NECK TECHNIQUE: Multidetector CT imaging of the head and neck was performed using the standard protocol during bolus administration of intravenous contrast. Multiplanar CT image reconstructions and MIPs were obtained to evaluate the vascular anatomy. Carotid stenosis measurements (when applicable) are obtained utilizing NASCET criteria, using the distal internal carotid diameter as the denominator. CONTRAST:  75mL OMNIPAQUE IOHEXOL 350 MG/ML SOLN COMPARISON:  MRI  06/09/2015 FINDINGS: CT HEAD Brain: Acute infarct left pons best seen on MRI. No hemorrhage. No other area of infarction. Ventricle size is normal. Calvarium and skull base: Negative Paranasal sinuses: Mucosal edema in the ethmoid sinuses bilaterally. No air-fluid level. Orbits: Negative CTA NECK Aortic arch: Normal aortic arch. Proximal great vessels widely patent. Lung apices clear. Right carotid system: Normal right carotid. No dissection or stenosis Left carotid system: Normal left carotid. No dissection or stenosis. Vertebral arteries:Both vertebral arteries widely patent. Right vertebral artery dominant. No dissection or stenosis in the vertebral arteries. Skeleton: No acute skeletal abnormality. Other neck: negative CTA HEAD Anterior circulation: Cavernous carotid widely patent bilaterally. No evidence of atherosclerotic disease stenosis or aneurysm. Anterior and middle cerebral arteries widely patent bilaterally Posterior circulation: Both vertebral arteries patent to the basilar without stenosis. PICA patent bilaterally. Basilar widely patent without stenosis or thrombus. Superior cerebellar and posterior cerebral arteries patent bilaterally. Fetal origin right posterior cerebral artery with hypoplastic right P1 segment, a normal variant Venous sinuses: Patent Anatomic variants: None Delayed phase: Normal enhancement on delayed imaging. IMPRESSION: Acute infarct left pons, best seen on MRI. Normal CTA of the head and neck. Negative for atherosclerotic disease, dissection or thrombosis. Electronically Signed   By: Marlan Palau M.D.   On: 06/10/2015 15:34   Mr Laqueta Jean UJ Contrast  06/09/2015  CLINICAL DATA:  Slurred speech with right-sided weakness. EXAM: MRI HEAD WITHOUT AND WITH CONTRAST TECHNIQUE: Multiplanar, multiecho pulse sequences of the brain and surrounding structures were obtained without and with intravenous contrast. CONTRAST:  15mL MULTIHANCE GADOBENATE DIMEGLUMINE 529 MG/ML IV SOLN  COMPARISON:  Head CT from earlier today FINDINGS: Calvarium and upper cervical spine: No focal marrow signal abnormality. Orbits: No significant findings. Sinuses and Mastoids: Sinusitis with mucosal thickening greatest and posterior right ethmoid air cell. Brain: There is restricted diffusion in the left pons consistent with acute infarct. More inferiorly, there is gliosis in the right pons consistent with remote infarct. No other ischemic changes in the remainder of the brain. Normal flow related signal loss in the visualized vertebral and basilar arteries. No abnormal intracranial enhancement. No hemorrhage, hydrocephalus, or mass lesion. IMPRESSION: 1. Acute left pontine infarction. 2. Remote right pontine infarct. 3. No ischemic injury outside of the pons. Electronically Signed   By: Marnee Spring M.D.   On: 06/09/2015 14:43   US Carotid Bilateral  06/09/2015  CLINICAL DATA:  Stroke, trouble swallowing, difficulty walking, speech disturbance. History of smoking. EXAM: BILATERAL CAROTID DUPLEX ULTRASOUND TECHNIQUE: Wallace Cullens scale imaging, color Doppler and duplex ultrasound were performed of bilateral carotid and vertebral arteries in the neck. COMPARISON:  Brain MRI -earlier same day; head CT -earlier same day FINDINGS: Criteria: Quantification of carotid stenosis is based on velocity parameters that correlate the residual internal carotid diameter with NASCET-based stenosis levels, using the diameter of the distal internal  carotid lumen as the denominator for stenosis measurement. The following velocity measurements were obtained: RIGHT ICA:  115/36 cm/sec CCA:  155/37 cm/sec SYSTOLIC ICA/CCA RATIO:  0.7 DIASTOLIC ICA/CCA RATIO:  0.7 ECA:  129 cm/sec LEFT ICA:  117/39 cm/sec CCA:  163/37 cm/sec SYSTOLIC ICA/CCA RATIO:  0.7 DIASTOLIC ICA/CCA RATIO:  1.1 ECA:  123 cm/sec RIGHT CAROTID ARTERY: There is a minimal amount of eccentric mixed echogenic plaque within the right carotid bulb (images 16 and 17),  extending to involve the origin and proximal aspect of the right internal carotid artery (image 24, not resulting in elevated peak systolic velocities within the interrogated course of the right internal carotid artery to suggest a hemodynamically significant stenosis. RIGHT VERTEBRAL ARTERY:  Antegrade flow LEFT CAROTID ARTERY: There is no grayscale evidence of significant intimal thickening or atherosclerotic plaque affecting the interrogated portions of the left carotid system. There are no elevated peak systolic velocities within the interrogated course of the left internal carotid artery to suggest a hemodynamically significant stenosis. LEFT VERTEBRAL ARTERY:  Antegrade flow IMPRESSION: 1. Very minimal amount of right-sided atherosclerotic plaque, not resulting in a hemodynamically significant stenosis. 2. Normal sonographic evaluation of the left carotid system. Electronically Signed   By: Simonne ComeJohn  Watts M.D.   On: 06/09/2015 16:43   @IMAGES @  Assessment: 1. Dysphagia in the setting of pontine stroke. Patient had no dysphagia symptoms prior to the first episode month and a half ago. He does have a history of some heartburn. His dysphagia is most likely related to the stroke typically in the standpoint that it is now a bilateral pontine affectation. SLP evaluation noted.  Recommendations: Standard barium swallow before doing EGD. It is of note that he is currently on 3 different anticoagulant medications would be a contraindication toward esophagus dilatation would there be a lesion noted. Management of problems on EGD and timing of EGD will be a function of the barium swallow result. Plans for TEE to rule out bacterial endocarditis noted.  Thank you for the consult. Please call with questions or concerns.  Christena DeemSKULSKIE, Vidur Knust U, MD  06/11/2015 12:35 PM

## 2015-06-12 ENCOUNTER — Inpatient Hospital Stay
Admit: 2015-06-12 | Discharge: 2015-06-12 | Disposition: A | Discharge status: Home / Self Care | Payer: Self-pay | Attending: Internal Medicine | Admitting: Internal Medicine

## 2015-06-12 ENCOUNTER — Inpatient Hospital Stay: Payer: Self-pay

## 2015-06-12 ENCOUNTER — Encounter
Admission: EM | Disposition: A | Discharge status: Home / Self Care | Payer: Self-pay | Source: Home / Self Care | Attending: Internal Medicine

## 2015-06-12 DIAGNOSIS — I6312 Cerebral infarction due to embolism of basilar artery: Secondary | ICD-10-CM

## 2015-06-12 SURGERY — ECHOCARDIOGRAM, TRANSESOPHAGEAL
Anesthesia: Moderate Sedation

## 2015-06-12 MED ORDER — MIDAZOLAM HCL 2 MG/2ML IJ SOLN
INTRAMUSCULAR | Status: DC | PRN
  Administered 2015-06-12 (×2): 1 mg via INTRAVENOUS

## 2015-06-12 MED ORDER — MIDAZOLAM HCL 2 MG/2ML IJ SOLN
INTRAMUSCULAR | Status: DC | PRN
  Administered 2015-06-12: 09:00:00 2 mg via INTRAVENOUS

## 2015-06-12 MED ORDER — SODIUM CHLORIDE 0.9 % IJ SOLN
INTRAMUSCULAR | Status: AC
  Filled 2015-06-12: qty 3

## 2015-06-12 MED ORDER — FENTANYL CITRATE (PF) 100 MCG/2ML IJ SOLN
INTRAMUSCULAR | Status: DC | PRN
  Administered 2015-06-12 (×4): 25 ug via INTRAVENOUS

## 2015-06-12 MED ORDER — ATORVASTATIN CALCIUM 20 MG PO TABS
80.0000 mg | ORAL_TABLET | Freq: Every day | ORAL | Status: DC
  Administered 2015-06-12: 80 mg via ORAL
  Filled 2015-06-12: qty 4

## 2015-06-12 MED ORDER — BUTAMBEN-TETRACAINE-BENZOCAINE 2-2-14 % EX AERO
INHALATION_SPRAY | CUTANEOUS | Status: AC
  Filled 2015-06-12: qty 20

## 2015-06-12 MED ORDER — NICOTINE 21 MG/24HR TD PT24
21.0000 mg | MEDICATED_PATCH | Freq: Every day | TRANSDERMAL | Status: DC
  Administered 2015-06-13: 11:00:00 21 mg via TRANSDERMAL
  Filled 2015-06-12 (×2): qty 1

## 2015-06-12 MED ORDER — FENTANYL CITRATE (PF) 100 MCG/2ML IJ SOLN
INTRAMUSCULAR | Status: DC | PRN
  Administered 2015-06-12: 50 ug via INTRAVENOUS

## 2015-06-12 MED ORDER — FENTANYL CITRATE (PF) 100 MCG/2ML IJ SOLN
INTRAMUSCULAR | Status: AC
  Filled 2015-06-12: qty 4

## 2015-06-12 MED ORDER — SODIUM CHLORIDE 0.9 % IV SOLN: INTRAVENOUS | Status: DC

## 2015-06-12 MED ORDER — LORAZEPAM 2 MG/ML IJ SOLN
INTRAMUSCULAR | Status: DC | PRN
  Administered 2015-06-12 (×2): 1 mg via INTRAVENOUS

## 2015-06-12 MED ORDER — MIDAZOLAM HCL 5 MG/5ML IJ SOLN
INTRAMUSCULAR | Status: AC
  Filled 2015-06-12: qty 10

## 2015-06-12 MED ORDER — MIDAZOLAM HCL 2 MG/2ML IJ SOLN
INTRAMUSCULAR | Status: AC | PRN
  Administered 2015-06-12: 2 mg via INTRAVENOUS

## 2015-06-12 MED ORDER — LIDOCAINE VISCOUS 2 % MT SOLN
OROMUCOSAL | Status: AC
  Filled 2015-06-12: qty 15

## 2015-06-12 NOTE — Progress Notes (Signed)
*  PRELIMINARY RESULTS* Echocardiogram 2D Echocardiogram has been performed.  Steven HousekeeperJerry R Hege 06/12/2015, 9:27 AM

## 2015-06-12 NOTE — Progress Notes (Addendum)
Subjective:   states to be doing reasonably well having trouble with swallowing recently had swelling test preop for transesophageal echocardiogram. Patient still has focal weakness status post CVA  Objective:  Vital Signs in the last 24 hours: Temp:  [97.8 F (36.6 C)-98.6 F (37 C)] 98 F (36.7 C) (10/17 0747) Pulse Rate:  [45-80] 80 (10/17 0930) Resp:  [11-20] 19 (10/17 0930) BP: (95-137)/(58-86) 134/64 mmHg (10/17 0930) SpO2:  [97 %-100 %] 99 % (10/17 0930)  Intake/Output from previous day: 10/16 0701 - 10/17 0700 In: 240 [P.O.:240] Out: -  Intake/Output from this shift:    Physical Exam: General appearance: cooperative and appears stated age Neck: no adenopathy, no carotid bruit, no JVD, supple, symmetrical, trachea midline and thyroid not enlarged, symmetric, no tenderness/mass/nodules Lungs: clear to auscultation bilaterally Heart: regular rate and rhythm, S1, S2 normal, no murmur, click, rub or gallop Abdomen: soft, non-tender; bowel sounds normal; no masses,  no organomegaly Extremities: extremities normal, atraumatic, no cyanosis or edema Pulses: 2+ and symmetric Skin: Skin color, texture, turgor normal. No rashes or lesions Neurologic: Motor: barely complete motion against gravity tibialis posterior on the left Coordination: Ataxia and weakness on the left side requiring a walker Gait: Abnormal  Lab Results:  Recent Labs  06/10/15 0246 06/11/15 0544  WBC 9.9 8.4  HGB 14.1 14.1  PLT 325 318    Recent Labs  06/09/15 1026 06/10/15 0246  NA 142 141  K 4.3 3.7  CL 112* 111  CO2 24 24  GLUCOSE 107* 85  BUN 13 15  CREATININE 0.84 0.98    Recent Labs  06/10/15 0246 06/10/15 1021  TROPONINI <0.03 <0.03   Hepatic Function Panel  Recent Labs  06/09/15 1026  PROT 7.1  ALBUMIN 4.0  AST 25  ALT 40  ALKPHOS 79  BILITOT 0.7    Recent Labs  06/10/15 0246  CHOL 199   No results for input(s): PROTIME in the last 72 hours.  Imaging: Imaging  results have been reviewed  Cardiac Studies:  Assessment/Plan:  Status post CVA   left-sided weakness  altered mental status improved  GERD  hyperlipidemia  smoking  peripheral vascular disease  abnormal MRI . PLAN  agree with physical therapy  continued gait training and occupational therapy  continue aspirin and Plavix therapy status post CVA  recommend evaluation for possible arrhythmia like atrial fibrillation possibly with an event monitor  agree with echocardiogram for assessment of thrombus  proceed with transesophageal echocardiogram for evaluation of thrombus  continue Protonix for GERD symptoms  continue speaks for us swallowing difficulty  advised patient to quit smoking as part of secondary prevention  LOS: 3 days    Emillie Chasen D. 06/12/2015, 9:57 AM

## 2015-06-12 NOTE — Progress Notes (Addendum)
[facility] [place] [facility]   PATIENT NAME: Steven RopesBarry Kissner    MR#:  [id number]  DATE OF BIRTH:  1975-12-22  SUBJECTIVE:  CHIEF COMPLAINT:  Patient is resting comfortably. He had his TEE today which was normal. Reporting difficulty and sticking sensation while swallowing from September 2016 . Denies any headache.  REVIEW OF SYSTEMS:  CONSTITUTIONAL: No fever, fatigue or weakness.  EYES: No blurred or double vision.  EARS, NOSE, AND THROAT: No tinnitus or ear pain.  RESPIRATORY: No cough, shortness of breath, wheezing or hemoptysis.  CARDIOVASCULAR: No chest pain, orthopnea, edema.  GASTROINTESTINAL: No nausea, vomiting, diarrhea or abdominal pain. Reports dysphagia GENITOURINARY: No dysuria, hematuria.  ENDOCRINE: No polyuria, nocturia,  HEMATOLOGY: No anemia, easy bruising or bleeding SKIN: No rash or lesion. MUSCULOSKELETAL: No joint pain or arthritis.   NEUROLOGIC: No tingling, numbness, weakness.  PSYCHIATRY: No anxiety or depression.   DRUG ALLERGIES:  No Known Allergies  VITALS:  Blood pressure 163/77, pulse 52, temperature 97.8 F (36.6 C), temperature source Oral, resp. rate 20, height 5\' 8"  (1.727 m), weight 76.295 kg (168 lb 3.2 oz), SpO2 100 %.  PHYSICAL EXAMINATION:  GENERAL:  39 y.o.-year-old patient lying in the bed with no acute distress.  EYES: Pupils equal, round, reactive to light and accommodation. No scleral icterus. Extraocular muscles intact.  HEENT: Head atraumatic, normocephalic. Oropharynx and nasopharynx clear.  NECK:  Supple, no jugular venous distention. No thyroid enlargement, no tenderness.  LUNGS: Normal breath sounds bilaterally, no wheezing, rales,rhonchi or crepitation. No use of accessory muscles of respiration.  CARDIOVASCULAR: S1, S2 normal. No murmurs, rubs, or gallops.  ABDOMEN: Soft, nontender, nondistended. Bowel sounds present. No organomegaly or mass.  EXTREMITIES: No pedal edema, cyanosis, or clubbing.   NEUROLOGIC: Cranial nerves II through XII are intact. Muscle strength 5/5 in all extremities. Sensation intact. Gait not checked.  PSYCHIATRIC: The patient is alert and oriented x 3.  SKIN: No obvious rash, lesion, or ulcer. Multiple tattoos were noticed on the body   LABORATORY PANEL:   CBC  Recent Labs Lab 06/11/15 0544  WBC 8.4  HGB 14.1  HCT 40.5  PLT 318   ------------------------------------------------------------------------------------------------------------------  Chemistries   Recent Labs Lab 06/09/15 1026 06/10/15 0246  NA 142 141  K 4.3 3.7  CL 112* 111  CO2 24 24  GLUCOSE 107* 85  BUN 13 15  CREATININE 0.84 0.98  CALCIUM 9.4 9.0  AST 25  --   ALT 40  --   ALKPHOS 79  --   BILITOT 0.7  --    ------------------------------------------------------------------------------------------------------------------  Cardiac Enzymes  Recent Labs Lab 06/10/15 1021  TROPONINI <0.03   ------------------------------------------------------------------------------------------------------------------  RADIOLOGY:  No results found.  EKG:   Orders placed or performed during the hospital encounter of 06/09/15  . ED EKG  . ED EKG  . EKG 12-Lead  . 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 No regional wall motion abnormalities..  TEE done today, normal results as reported by cardiology to RN carotid Dopplers normal  blood cultures NTD . Patient is afebrile. Will start him on IV antibiotics if blood cultures are positive hypercoagulability panel pending PT  recommending outpatient physical therapy Appreciate neurology consult for recurrent  strokes in this young patient. Recs op holter or LINQ by neurology  He is also  had recent chills and rigors with negative UA and chest x-ray suggesting possible endocarditis.  Denies IV drug use.  #2 subjective fever with chills Chest x-rays negativeblood cultures are NTD. No cardiac murmur noticed Echocardiogram is pending TEE is ordered as there is a concern for embolic stroke Concern for endocarditis  but patient denies any IV drug use. Not febrile. Blood cultures are pending. No cardia murmur noticed. No leukocytosis either. Will consider IV antibiotics if cultures are positive or patient is febrile   #3 dysphagia for few weeeeks PPI and Barium swallow in am Appreciate  GI  recommendations   #4 tobacco abuse   counseled patient to quit smoking for 3-5 minutes. Agreeable. We'll start him on nicotine patch.   #5 urine with budding yeast-probably a contaminant patient is afebrile. Blood Cultures are negative so far Will discontinue Diflucan  Disposition in a.m. to home with home PT:  All the records are reviewed and case discussed with Care Management/Social Workerr. Management plans discussed with the patient, family and they are in agreement.  CODE STATUS: full   TOTAL TIME TAKING CARE OF THIS PATIENT: 35  minutes.   POSSIBLE D/C IN am DAYS, DEPENDING ON CLINICAL CONDITION.   Ramonita Lab M.D on 06/12/2015 at 3:59 PM  Between 7am to 6pm - Pager - [phone] After 6pm go to [email or web address] - password [email or web address] [facility]  [facility]  Office  [phone]  CC: Primary care physician; No PCP Per Patient

## 2015-06-12 NOTE — Evaluation (Signed)
Occupational Therapy Evaluation Patient Details Name: Steven Mclean MRN: [id number] DOB: [birth year] Today's Date: 06/12/2015    History of Present Illness Patient is a 39 y/o male that presents with R sided weakness and slurred speech. 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    Clinical Impression   Patient seen for OT evaluation with mother and later step father present.  He is able to ambulate for short distances from bed to toilet without losing balance but feels unsteady and does not reach for objects like door or bedside table.  He states his dizziness has improved but still feels like he cannot walk in a straight line for long distances.  He is able to complete dressing skills sitting EOB and complete toileting and hygiene with supervision, but presents with decreased fine motor coordination in RUE and hand which is his dominant extremity.  He would benefit from instruction in fine motor exercises, theraputty exercises to increase strength and function for ADLs.  Rec OT 1-2 more sessions for education in home exercise program.    Follow Up Recommendations  No OT follow up    Equipment Recommendations       Recommendations for Other Services       Precautions / Restrictions Precautions Precautions: Fall Restrictions Weight Bearing Restrictions: No      Mobility Bed Mobility Overal bed mobility: Independent                Transfers Overall transfer level: Independent Equipment used: None                  Balance                                            ADL Overall ADL's : Modified independent                                       General ADL Comments: Pt is able to complete self care skills using bilateral hands but needs extra time to complete tasks due to decreased control of RUE and hand.  Supervision and min cues needed for safety and balance when ambulating longer distances.        Vision     Perception     Praxis      Pertinent Vitals/Pain Pain Assessment: No/denies pain     Hand Dominance Right   Extremity/Trunk Assessment Upper Extremity Assessment Upper Extremity Assessment: Generalized weakness;RUE deficits/detail RUE Deficits / Details: intact sensation but decreased fine motor skills esp for writing name and letters RUE Coordination: decreased fine motor   Lower Extremity Assessment Lower Extremity Assessment: Defer to PT evaluation       Communication Communication Communication: No difficulties   Cognition Arousal/Alertness: Awake/alert Behavior During Therapy: WFL for tasks assessed/performed Overall Cognitive Status: Within Functional Limits for tasks assessed                     General Comments       Exercises       Shoulder Instructions      Home Living Family/patient expects to be discharged to:: Private residence Living Arrangements: Spouse/significant other Available Help at Discharge: Family Type of Home: House Home Access: Stairs to enter;Ramped entrance Entrance Stairs-Number of Steps:  3   Home Layout: One level     Bathroom Shower/Tub: Tub/shower unit (fully accessible handicapped bathroom with seat built in with hand held shower and grab bars) Shower/tub characteristics: Door Bathroom Toilet: Handicapped height Bathroom Accessibility: Yes How Accessible: Accessible via walker     Additional Comments: He moved into his grandfather's old house after he passed. House is completely handicap accessible.       Prior Functioning/Environment Level of Independence: Independent        Comments: Patient reports he has been working, though notes balance deficits over the past month.     OT Diagnosis: Generalized weakness;Other (comment) (decreased fine motor skills and control of RUE and hand)   OT Problem List: Decreased strength;Decreased activity tolerance;Decreased coordination   OT  Treatment/Interventions: Therapeutic exercise;Other (comment) (educatiion in home exercise program)    OT Goals(Current goals can be found in the care plan section) Acute Rehab OT Goals Patient Stated Goal: To return home safely  OT Goal Formulation: With patient/family Time For Goal Achievement: 06/26/15 Potential to Achieve Goals: Good  OT Frequency: Min 1X/week   Barriers to D/C:            Co-evaluation              End of Session    Activity Tolerance: Patient limited by fatigue Patient left: in bed;with call bell/phone within reach;with bed alarm set;with family/visitor present (mother and step father present for eval)   Time: 1545-1630 OT Time Calculation (min): 45 min Charges:  OT General Charges $OT Visit: 1 Procedure OT Evaluation $Initial OT Evaluation Tier I: 1 Procedure OT Treatments $Therapeutic Activity: 23-37 mins G-Codes:    Lindsee Labarre 06/12/2015, 4:49 PM    Susanne BordersSusan Merric Yost, OTR/L ascom [phone]

## 2015-06-12 NOTE — Progress Notes (Signed)
Physical Therapy Treatment Patient Details Name: Steven Mclean MRN: [id number] DOB: 09/10/1975 Today's Date: 06/12/2015    History of Present Illness Patient is a 39 y/o male that presents with R sided weakness and slurred speech. 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     PT Comments    Pt mildly agitated with need for PT, but apologetic post session. Pt demonstrates independence with all functional mobility. Pt would benefit from higher level strength and balance activities to ensure improvement to prior level of function. If continues in acute would require updated goals.   Follow Up Recommendations  Outpatient PT     Equipment Recommendations  None recommended by PT    Recommendations for Other Services       Precautions / Restrictions Precautions Precautions: Fall    Mobility  Bed Mobility Overal bed mobility: Independent                Transfers Overall transfer level: Independent Equipment used: None                Ambulation/Gait Ambulation/Gait assistance: Independent Ambulation Distance (Feet): 400 Feet Assistive device: None Gait Pattern/deviations: WFL(Within Functional Limits) Gait velocity: increased speed Gait velocity interpretation: at or above normal speed for age/gender General Gait Details:  (N LOB noted)   Stairs Stairs: Yes Stairs assistance: Independent Stair Management: One rail Right (very light use) Number of Stairs: 6 General stair comments: Up/down reciprocally and quickly  Wheelchair Mobility    Modified Rankin (Stroke Patients Only)       Balance                                    Cognition Arousal/Alertness: Awake/alert Behavior During Therapy: WFL for tasks assessed/performed;Impulsive Overall Cognitive Status: Within Functional Limits for tasks assessed                      Exercises      General Comments        Pertinent Vitals/Pain Pain  Assessment: No/denies pain    Home Living                      Prior Function            PT Goals (current goals can now be found in the care plan section) Progress towards PT goals: Progressing toward goals    Frequency  7X/week    PT Plan Current plan remains appropriate    Co-evaluation             End of Session Equipment Utilized During Treatment: Gait belt Activity Tolerance: Patient tolerated treatment well (agitated with needing treatment; apologetic post session) Patient left: in bed;in CPM;with family/visitor present (does not wish alarm)     Time: [id number] PT Time Calculation (min) (ACUTE ONLY): 10 min  Charges:  $Gait Training: 8-22 mins                    G Codes:      Kristeen MissHeidi Elizabeth Bishop 06/12/2015, 3:20 PM

## 2015-06-12 NOTE — Procedures (Signed)
TEE  Indication  CVA  Sedation:  6 mg iv versed; 150 mg fentanyl  After informed consent, time out protocol and adequate sedation tee completed. No evidence of complication.  Preliminary  No evidence of cardiac source of emboli.

## 2015-06-12 NOTE — Plan of Care (Signed)
Problem: Discharge/Transitional Outcomes Goal: Other Discharge Outcomes/Goals Outcome: Progressing VSS. NIH score 1. No neurological changes during the shift. Tolerates diet. NPO after midnight for TEE/barium swallow. Denies pain.

## 2015-06-12 NOTE — Plan of Care (Signed)
Problem: Discharge/Transitional Outcomes Goal: Other Discharge Outcomes/Goals Outcome: Progressing Plan of care progress to goal: Pt wanting to go outside. MD aware. Discussed situation with patient, MD does not want patient going outside. Nicotine patch ordered. No c/o pain. Tolerating dysphagia diet. VSS. TEE done today, negative for emboli. Pt to have barium swallow tomorrow.

## 2015-06-12 NOTE — Consult Note (Signed)
CC: speech abnormality and R side weakness.    HPI: Steven Mclean is an 39 y.o. male with no significant medical history outside of smoking presents to the emergency room with slurred speech, shaking, right arm weakness and altered mental status which has improved.  He reports that he had similar symptoms about 1 month ago but at that time the weakness was on his left side. 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  S/p TEE today History reviewed. No pertinent past medical history.  Past Surgical History  Procedure Laterality Date  . Dental surgery      Family History  Problem Relation Age of Onset  . Deep vein thrombosis Mother   . Stroke Other     Social History:  reports that he has been smoking Cigarettes.  He has been smoking about 1.00 pack per day. He does not have any smokeless tobacco history on file. He reports that he does not drink alcohol or use illicit drugs.  No Known Allergies  Medications: I have reviewed the patient's current medications.  ROS: History obtained from the patient  General ROS: negative for - chills, fatigue, fever, night sweats, weight gain or weight loss Psychological ROS: negative for - behavioral disorder, hallucinations, memory difficulties, mood swings or suicidal ideation Ophthalmic ROS: negative for - blurry vision, double vision, eye pain or loss of vision ENT ROS: negative for - epistaxis, nasal discharge, oral lesions, sore throat, tinnitus or vertigo Allergy and Immunology ROS: negative for - hives or itchy/watery eyes Hematological and Lymphatic ROS: negative for - bleeding problems, bruising or swollen lymph nodes Endocrine ROS: negative for - galactorrhea, hair pattern changes, polydipsia/polyuria or temperature intolerance Respiratory ROS: negative for - cough, hemoptysis, shortness of breath or wheezing Cardiovascular ROS: negative for - chest pain, dyspnea on exertion, edema or  irregular heartbeat Gastrointestinal ROS: negative for - abdominal pain, diarrhea, hematemesis, nausea/vomiting or stool incontinence Genito-Urinary ROS: negative for - dysuria, hematuria, incontinence or urinary frequency/urgency Musculoskeletal ROS: negative for - joint swelling or muscular weakness Neurological ROS: as noted in HPI Dermatological ROS: negative for rash and skin lesion changes  Physical Examination: Blood pressure 123/49, pulse 64, temperature 97.6 F (36.4 C), temperature source Oral, resp. rate 14, height  (1.727 m), weight 76.295 kg (168 lb 3.2 oz), SpO2 100 %.   Neurological Examination Mental Status: Alert, oriented, thought content appropriate.  Speech fluent without evidence of aphasia.  Able to follow 3 step commands without difficulty. Cranial Nerves: II: Discs flat bilaterally; Visual fields grossly normal, pupils equal, round, reactive to light and accommodation III,IV, VI: ptosis not present, extra-ocular motions intact bilaterally V,VII: smile symmetric, facial light touch sensation normal bilaterally VIII: hearing normal bilaterally IX,X: gag reflex present XI: bilateral shoulder shrug XII: midline tongue extension Motor: Right : Upper extremity   5/5    Left:     Upper extremity   5/5  Lower extremity   5/5     Lower extremity   5/5 Tone and bulk:normal tone throughout; no atrophy noted Sensory: Pinprick and light touch intact throughout, bilaterally Deep Tendon Reflexes: 1+ and symmetric throughout Plantars: Right: downgoing   Left: downgoing Cerebellar: normal finger-to-nose, normal rapid alternating movements and normal heel-to-shin test Gait: not tested.       Laboratory Studies:   Basic Metabolic Panel:  Recent Labs Lab 06/09/15 1026 06/10/15 0246  NA 142 141  K 4.3 3.7  CL 112* 111  CO2  24 24  GLUCOSE 107* 85  BUN 13 15  CREATININE 0.84 0.98  CALCIUM 9.4 9.0    Liver Function Tests:  Recent Labs Lab 06/09/15 1026   AST 25  ALT 40  ALKPHOS 79  BILITOT 0.7  PROT 7.1  ALBUMIN 4.0   No results for input(s): LIPASE, AMYLASE in the last 168 hours. No results for input(s): AMMONIA in the last 168 hours.  CBC:  Recent Labs Lab 06/09/15 1026 06/10/15 0246 06/11/15 0544  WBC 11.5* 9.9 8.4  NEUTROABS 8.4*  --   --   HGB 14.4 14.1 14.1  HCT 43.6 41.2 40.5  MCV 90.6 90.7 89.9  PLT 384 325 318    Cardiac Enzymes:  Recent Labs Lab 06/09/15 1026 06/09/15 2132 06/10/15 0246 06/10/15 1021  TROPONINI <0.03 <0.03 <0.03 <0.03    BNP: Invalid input(s): POCBNP  CBG: No results for input(s): GLUCAP in the last 168 hours.  Microbiology: Results for orders placed or performed during the hospital encounter of 06/09/15  Culture, blood (routine x 2)     Status: None (Preliminary result)   Collection Time: 06/09/15  3:46 PM  Result Value Ref Range Status   Specimen Description BLOOD RIGHT FOREARM  Final   Special Requests BOTTLES DRAWN AEROBIC AND ANAEROBIC  3CC  Final   Culture NO GROWTH 3 DAYS  Final   Report Status PENDING  Incomplete  Culture, blood (routine x 2)     Status: None (Preliminary result)   Collection Time: 06/09/15  3:47 PM  Result Value Ref Range Status   Specimen Description BLOOD LEFT FOREARM  Final   Special Requests BOTTLES DRAWN AEROBIC AND ANAEROBIC  6CC  Final   Culture NO GROWTH 3 DAYS  Final   Report Status PENDING  Incomplete    Coagulation Studies: No results for input(s): LABPROT, INR in the last 72 hours.  Urinalysis:   Recent Labs Lab 06/09/15 1158  COLORURINE YELLOW*  LABSPEC 1.019  PHURINE 7.0  GLUCOSEU NEGATIVE  HGBUR NEGATIVE  BILIRUBINUR NEGATIVE  KETONESUR NEGATIVE  PROTEINUR NEGATIVE  NITRITE NEGATIVE  LEUKOCYTESUR NEGATIVE    Lipid Panel:     Component Value Date/Time   CHOL 199 06/10/2015 0246   TRIG 44 06/10/2015 0246   HDL 66 06/10/2015 0246   CHOLHDL 3.0 06/10/2015 0246   VLDL 9 06/10/2015 0246   LDLCALC 124* 06/10/2015 0246     HgbA1C:  Lab Results  Component Value Date   HGBA1C 5.3 06/10/2015    Urine Drug Screen:      Component Value Date/Time   LABOPIA NONE DETECTED 06/10/2015 1445   LABBENZ NONE DETECTED 06/10/2015 1445   AMPHETMU NONE DETECTED 06/10/2015 1445   THCU POSITIVE* 06/10/2015 1445   LABBARB NONE DETECTED 06/10/2015 1445    Alcohol Level:   Recent Labs Lab 06/09/15 1026  ETH <5      Imaging: Ct Angio Head W/cm &/or Wo Cm  06/10/2015  CLINICAL DATA:  Left pontine stroke EXAM: CT ANGIOGRAPHY HEAD AND NECK TECHNIQUE: Multidetector CT imaging of the head and neck was performed using the standard protocol during bolus administration of intravenous contrast. Multiplanar CT image reconstructions and MIPs were obtained to evaluate the vascular anatomy. Carotid stenosis measurements (when applicable) are obtained utilizing NASCET criteria, using the distal internal carotid diameter as the denominator. CONTRAST:  75mL OMNIPAQUE IOHEXOL 350 MG/ML SOLN COMPARISON:  MRI 06/09/2015 FINDINGS: CT HEAD Brain: Acute infarct left pons best seen on MRI. No hemorrhage. No other area of  infarction. Ventricle size is normal. Calvarium and skull base: Negative Paranasal sinuses: Mucosal edema in the ethmoid sinuses bilaterally. No air-fluid level. Orbits: Negative CTA NECK Aortic arch: Normal aortic arch. Proximal great vessels widely patent. Lung apices clear. Right carotid system: Normal right carotid. No dissection or stenosis Left carotid system: Normal left carotid. No dissection or stenosis. Vertebral arteries:Both vertebral arteries widely patent. Right vertebral artery dominant. No dissection or stenosis in the vertebral arteries. Skeleton: No acute skeletal abnormality. Other neck: negative CTA HEAD Anterior circulation: Cavernous carotid widely patent bilaterally. No evidence of atherosclerotic disease stenosis or aneurysm. Anterior and middle cerebral arteries widely patent bilaterally Posterior  circulation: Both vertebral arteries patent to the basilar without stenosis. PICA patent bilaterally. Basilar widely patent without stenosis or thrombus. Superior cerebellar and posterior cerebral arteries patent bilaterally. Fetal origin right posterior cerebral artery with hypoplastic right P1 segment, a normal variant Venous sinuses: Patent Anatomic variants: None Delayed phase: Normal enhancement on delayed imaging. IMPRESSION: Acute infarct left pons, best seen on MRI. Normal CTA of the head and neck. Negative for atherosclerotic disease, dissection or thrombosis. Electronically Signed   By: Marlan Palau M.D.   On: 06/10/2015 15:34   Ct Angio Neck W/cm &/or Wo/cm  06/10/2015  CLINICAL DATA:  Left pontine stroke EXAM: CT ANGIOGRAPHY HEAD AND NECK TECHNIQUE: Multidetector CT imaging of the head and neck was performed using the standard protocol during bolus administration of intravenous contrast. Multiplanar CT image reconstructions and MIPs were obtained to evaluate the vascular anatomy. Carotid stenosis measurements (when applicable) are obtained utilizing NASCET criteria, using the distal internal carotid diameter as the denominator. CONTRAST:  75mL OMNIPAQUE IOHEXOL 350 MG/ML SOLN COMPARISON:  MRI 06/09/2015 FINDINGS: CT HEAD Brain: Acute infarct left pons best seen on MRI. No hemorrhage. No other area of infarction. Ventricle size is normal. Calvarium and skull base: Negative Paranasal sinuses: Mucosal edema in the ethmoid sinuses bilaterally. No air-fluid level. Orbits: Negative CTA NECK Aortic arch: Normal aortic arch. Proximal great vessels widely patent. Lung apices clear. Right carotid system: Normal right carotid. No dissection or stenosis Left carotid system: Normal left carotid. No dissection or stenosis. Vertebral arteries:Both vertebral arteries widely patent. Right vertebral artery dominant. No dissection or stenosis in the vertebral arteries. Skeleton: No acute skeletal abnormality. Other  neck: negative CTA HEAD Anterior circulation: Cavernous carotid widely patent bilaterally. No evidence of atherosclerotic disease stenosis or aneurysm. Anterior and middle cerebral arteries widely patent bilaterally Posterior circulation: Both vertebral arteries patent to the basilar without stenosis. PICA patent bilaterally. Basilar widely patent without stenosis or thrombus. Superior cerebellar and posterior cerebral arteries patent bilaterally. Fetal origin right posterior cerebral artery with hypoplastic right P1 segment, a normal variant Venous sinuses: Patent Anatomic variants: None Delayed phase: Normal enhancement on delayed imaging. IMPRESSION: Acute infarct left pons, best seen on MRI. Normal CTA of the head and neck. Negative for atherosclerotic disease, dissection or thrombosis. Electronically Signed   By: Marlan Palau M.D.   On: 06/10/2015 15:34     Assessment/Plan:  39 y.o. male with no significant medical history outside of smoking presents to the emergency room with slurred speech, shaking, right arm weakness and altered mental status which has improved.  He reports that he had similar symptoms about 1 month ago but at that time the weakness was on his left side. 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    Risk factors for stroke include smoking   S/p  TEE today, and preliminary no signs of PFO or thrombus  - d/c planning with loop recorder/Lin[employer] monitoring scheduled as out pt by neurology - out pt follow up with neurology for hypercoagulable profile.   - con't anti platelet and statin therapy  Pauletta Mclean, Steven Esterline   06/12/2015, 12:55 PM

## 2015-06-12 NOTE — Consult Note (Signed)
Subjective: Patient seen for acute onset dysphagia following CVA. States he is swallowing some better today, less weakness.   Objective: Vital signs in last 24 hours: Temp:  [97.6 F (36.4 C)-98.6 F (37 C)] 97.6 F (36.4 C) (10/17 1002) Pulse Rate:  [45-80] 64 (10/17 1002) Resp:  [11-20] 19 (10/17 0930) BP: (95-137)/(49-86) 123/49 mmHg (10/17 1002) SpO2:  [97 %-100 %] 100 % (10/17 1002) Blood pressure 123/49, pulse 64, temperature 97.6 F (36.4 C), temperature source Oral, resp. rate 14, height 5\' 8"  (1.727 m), weight 76.295 kg (168 lb 3.2 oz), SpO2 100 %.   Intake/Output from previous day: 10/16 0701 - 10/17 0700 In: 240 [P.O.:240] Out: -   Intake/Output this shift:     General appearance:  Well appearing male no distress Resp:  cta Cardio:  rrr without rub, murmur or gallop GI:  Soft, nontender, nondistended, bowel sounds positive and normoactive.  Extremities:  No cce   Lab Results: No results found for this or any previous visit (from the past 24 hour(s)).    Recent Labs  06/10/15 0246 06/11/15 0544  WBC 9.9 8.4  HGB 14.1 14.1  HCT 41.2 40.5  PLT 325 318   BMET  Recent Labs  06/10/15 0246  NA 141  K 3.7  CL 111  CO2 24  GLUCOSE 85  BUN 15  CREATININE 0.98  CALCIUM 9.0   LFT No results for input(s): PROT, ALBUMIN, AST, ALT, ALKPHOS, BILITOT, BILIDIR, IBILI in the last 72 hours. PT/INR No results for input(s): LABPROT, INR in the last 72 hours. Hepatitis Panel No results for input(s): HEPBSAG, HCVAB, HEPAIGM, HEPBIGM in the last 72 hours. C-Diff No results for input(s): CDIFFTOX in the last 72 hours. No results for input(s): CDIFFPCR in the last 72 hours.   Studies/Results: Ct Angio Head W/cm &/or Wo Cm  06/10/2015  CLINICAL DATA:  Left pontine stroke EXAM: CT ANGIOGRAPHY HEAD AND NECK TECHNIQUE: Multidetector CT imaging of the head and neck was performed using the standard protocol during bolus administration of intravenous contrast.  Multiplanar CT image reconstructions and MIPs were obtained to evaluate the vascular anatomy. Carotid stenosis measurements (when applicable) are obtained utilizing NASCET criteria, using the distal internal carotid diameter as the denominator. CONTRAST:  75mL OMNIPAQUE IOHEXOL 350 MG/ML SOLN COMPARISON:  MRI 06/09/2015 FINDINGS: CT HEAD Brain: Acute infarct left pons best seen on MRI. No hemorrhage. No other area of infarction. Ventricle size is normal. Calvarium and skull base: Negative Paranasal sinuses: Mucosal edema in the ethmoid sinuses bilaterally. No air-fluid level. Orbits: Negative CTA NECK Aortic arch: Normal aortic arch. Proximal great vessels widely patent. Lung apices clear. Right carotid system: Normal right carotid. No dissection or stenosis Left carotid system: Normal left carotid. No dissection or stenosis. Vertebral arteries:Both vertebral arteries widely patent. Right vertebral artery dominant. No dissection or stenosis in the vertebral arteries. Skeleton: No acute skeletal abnormality. Other neck: negative CTA HEAD Anterior circulation: Cavernous carotid widely patent bilaterally. No evidence of atherosclerotic disease stenosis or aneurysm. Anterior and middle cerebral arteries widely patent bilaterally Posterior circulation: Both vertebral arteries patent to the basilar without stenosis. PICA patent bilaterally. Basilar widely patent without stenosis or thrombus. Superior cerebellar and posterior cerebral arteries patent bilaterally. Fetal origin right posterior cerebral artery with hypoplastic right P1 segment, a normal variant Venous sinuses: Patent Anatomic variants: None Delayed phase: Normal enhancement on delayed imaging. IMPRESSION: Acute infarct left pons, best seen on MRI. Normal CTA of the head and neck. Negative for atherosclerotic disease,  dissection or thrombosis. Electronically Signed   By: Marlan Palau M.D.   On: 06/10/2015 15:34   Ct Angio Neck W/cm &/or Wo/cm  06/10/2015   CLINICAL DATA:  Left pontine stroke EXAM: CT ANGIOGRAPHY HEAD AND NECK TECHNIQUE: Multidetector CT imaging of the head and neck was performed using the standard protocol during bolus administration of intravenous contrast. Multiplanar CT image reconstructions and MIPs were obtained to evaluate the vascular anatomy. Carotid stenosis measurements (when applicable) are obtained utilizing NASCET criteria, using the distal internal carotid diameter as the denominator. CONTRAST:  75mL OMNIPAQUE IOHEXOL 350 MG/ML SOLN COMPARISON:  MRI 06/09/2015 FINDINGS: CT HEAD Brain: Acute infarct left pons best seen on MRI. No hemorrhage. No other area of infarction. Ventricle size is normal. Calvarium and skull base: Negative Paranasal sinuses: Mucosal edema in the ethmoid sinuses bilaterally. No air-fluid level. Orbits: Negative CTA NECK Aortic arch: Normal aortic arch. Proximal great vessels widely patent. Lung apices clear. Right carotid system: Normal right carotid. No dissection or stenosis Left carotid system: Normal left carotid. No dissection or stenosis. Vertebral arteries:Both vertebral arteries widely patent. Right vertebral artery dominant. No dissection or stenosis in the vertebral arteries. Skeleton: No acute skeletal abnormality. Other neck: negative CTA HEAD Anterior circulation: Cavernous carotid widely patent bilaterally. No evidence of atherosclerotic disease stenosis or aneurysm. Anterior and middle cerebral arteries widely patent bilaterally Posterior circulation: Both vertebral arteries patent to the basilar without stenosis. PICA patent bilaterally. Basilar widely patent without stenosis or thrombus. Superior cerebellar and posterior cerebral arteries patent bilaterally. Fetal origin right posterior cerebral artery with hypoplastic right P1 segment, a normal variant Venous sinuses: Patent Anatomic variants: None Delayed phase: Normal enhancement on delayed imaging. IMPRESSION: Acute infarct left pons, best seen  on MRI. Normal CTA of the head and neck. Negative for atherosclerotic disease, dissection or thrombosis. Electronically Signed   By: Marlan Palau M.D.   On: 06/10/2015 15:34    Scheduled Inpatient Medications:   . aspirin  81 mg Oral Daily  . butamben-tetracaine-benzocaine      . clopidogrel  75 mg Oral Daily  . fentaNYL      . fluconazole  200 mg Oral Daily  . heparin  5,000 Units Subcutaneous 3 times per day  . Influenza vac split quadrivalent PF  0.5 mL Intramuscular Tomorrow-1000  . lidocaine      . midazolam      . nicotine  21 mg Transdermal Daily  . pantoprazole  40 mg Oral Daily  . pneumococcal 23 valent vaccine  0.5 mL Intramuscular Tomorrow-1000  . pravastatin  40 mg Oral QHS  . sodium chloride        Continuous Inpatient Infusions:     PRN Inpatient Medications:  fentaNYL, fentaNYL, LORazepam, midazolam, midazolam  Miscellaneous:   Assessment:  1) CVA with some dysphagia-denies currently.    Plan:  Recommend continuing plan for barium swallow tomorrow am.  Following.   Christena Deem MD 06/12/2015, 2:39 PM

## 2015-06-13 ENCOUNTER — Inpatient Hospital Stay: Payer: Self-pay

## 2015-06-13 MED ORDER — CLOPIDOGREL BISULFATE 75 MG PO TABS: 75.0000 mg | ORAL_TABLET | Freq: Every day | ORAL | Status: DC

## 2015-06-13 MED ORDER — ASPIRIN EC 81 MG PO TBEC: 81.0000 mg | DELAYED_RELEASE_TABLET | Freq: Every day | ORAL | Status: DC

## 2015-06-13 MED ORDER — NICOTINE 21 MG/24HR TD PT24: 21.0000 mg | MEDICATED_PATCH | Freq: Every day | TRANSDERMAL | Status: DC

## 2015-06-13 MED ORDER — PANTOPRAZOLE SODIUM 40 MG PO TBEC: 40.0000 mg | DELAYED_RELEASE_TABLET | Freq: Every day | ORAL | Status: DC

## 2015-06-13 MED ORDER — ATORVASTATIN CALCIUM 80 MG PO TABS: 80.0000 mg | ORAL_TABLET | Freq: Every day | ORAL | Status: AC

## 2015-06-13 NOTE — Plan of Care (Signed)
Problem: Discharge/Transitional Outcomes Goal: Other Discharge Outcomes/Goals Outcome: Progressing Plan of care progress to goal Smoking cessation education provided VSS, tolerating diet, ambulating independently NPO since midnight for barium swallow later today.

## 2015-06-13 NOTE — Progress Notes (Signed)
Patient discharged per MD orders. IV removed and in tact. Prescriptions given to patient. All discharge instructions given and all questions answered. Escorted by [employer].

## 2015-06-13 NOTE — Progress Notes (Signed)
Physical Therapy Treatment Patient Details Name: Steven Mclean MRN: [id number] DOB: 1976-07-17 Today's Date: 06/13/2015    History of Present Illness Patient is a 39 y/o male that presents with R sided weakness and slurred speech. 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     PT Comments    BERG balance re tested; pt scores 56/56. Pt demonstrates only minor challenge with initiating tandem stance and subjectively notes challenge with alternating toe taps, but able to complete fully. Pt/significant other broach subject of home exercises as well as need to quit smoking. Discussion/education on both subjects; pt becomes tearful, but accepting and motivated. Pt to be discharged home today with outpatient PT, which was strongly encouraged.   Follow Up Recommendations  Outpatient PT     Equipment Recommendations  None recommended by PT    Recommendations for Other Services       Precautions / Restrictions Restrictions Weight Bearing Restrictions: No    Mobility  Bed Mobility Overal bed mobility: Independent                Transfers Overall transfer level: Independent                  Ambulation/Gait                 Stairs            Wheelchair Mobility    Modified Rankin (Stroke Patients Only)       Balance                                    Cognition Arousal/Alertness: Awake/alert Behavior During Therapy: WFL for tasks assessed/performed Overall Cognitive Status: Within Functional Limits for tasks assessed                      Exercises Other Exercises Other Exercises: Pt educated on higher level balance activities for home; also discussed smoking cessation methods and rewards with pt/significant other    General Comments        Pertinent Vitals/Pain Pain Assessment: No/denies pain    Home Living                      Prior Function            PT Goals (current  goals can now be found in the care plan section) Progress towards PT goals: Goals met/education completed, patient discharged from PT    Frequency  7X/week    PT Plan Current plan remains appropriate    Co-evaluation             End of Session   Activity Tolerance: Patient tolerated treatment well Patient left: in chair;with call bell/phone within reach;with family/visitor present     Time: 7412-8786 PT Time Calculation (min) (ACUTE ONLY): 15 min  Charges:  $Neuromuscular Re-education: 8-22 mins                    G Codes:      Steven Mclean 06/13/2015, 11:06 AM

## 2015-06-13 NOTE — Discharge Instructions (Signed)
Diet healthy heart Activity as tolerated, as recommended by physical therapy Case management to arrange outpatient physical therapy Follow-up with primary care physician in 3 days Follow-up with cardiology Dr. Juliann Paresallwood in a week for outpatient loop monitor Follow-up with neurology Dr. Sherryll BurgerShah in a month Follow-up with gastroenterology Dr. Vilinda BlanksSkulsky in a week

## 2015-06-13 NOTE — Progress Notes (Signed)
Occupational Therapy Treatment Patient Details Name: Steven Mclean MRN: [id number] DOB: August 19, 1976 Today's Date: 06/13/2015    History of present illness Patient is a 39 y/o male that presents with R sided weakness and slurred speech. 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    OT comments  Pt seen for education in HEP including hand exercises including education in theraband and theraputty with teach back after demonstration.  Pt continues to progress in ADLs with improved function of RUE and hand. Pt and his wife educated in edema management in case edema appears in UE and hand and inhibitory extension exercises to be done if tone starts to increase too. Pt may go home today and will continue to follow until DC home.  Follow Up Recommendations  No OT follow up    Equipment Recommendations       Recommendations for Other Services      Precautions / Restrictions Precautions Precautions: Fall Restrictions Weight Bearing Restrictions: No       Mobility Bed Mobility Overal bed mobility: Independent                Transfers Overall transfer level: Independent                    Balance                                   ADL Overall ADL's : Modified independent                                       General ADL Comments: Pt continues to improve with balance and safety during ADLs.      Vision                     Perception     Praxis      Cognition   Behavior During Therapy: WFL for tasks assessed/performed Overall Cognitive Status: Within Functional Limits for tasks assessed                       Extremity/Trunk Assessment               Exercises Other Exercises Other Exercises: Pt educated on higher level balance activities for home; also discussed smoking cessation methods and rewards with pt/significant other   Shoulder Instructions       General Comments       Pertinent Vitals/ Pain       Pain Assessment: No/denies pain  Home Living                                          Prior Functioning/Environment              Frequency       Progress Toward Goals  OT Goals(current goals can now be found in the care plan section)  Progress towards OT goals: Progressing toward goals  Acute Rehab OT Goals Patient Stated Goal: To return home safely  OT Goal Formulation: With patient/family Time For Goal Achievement: 06/20/15 Potential to Achieve Goals: Good  Plan Discharge plan remains appropriate    Co-evaluation  End of Session Equipment Utilized During Treatment:  (theraband and theraputty with HEP)   Activity Tolerance Patient tolerated treatment well   Patient Left in bed;with family/visitor present;with bed alarm set   Nurse Communication          Time: 1030-1100 OT Time Calculation (min): 30 min  Charges: OT General Charges $OT Visit: 1 Procedure OT Treatments $Therapeutic Activity: 23-37 mins  Wofford,Susan 06/13/2015, 11:13 AM    Susanne BordersSusan Wofford, OTR/L ascom [phone]

## 2015-06-13 NOTE — Discharge Summary (Signed)
[facility] - [place] at [facility]   PATIENT NAME: Steven Mclean    MR#:  [id number]  DATE OF BIRTH:  [birth year]  DATE OF ADMISSION:  06/09/2015 ADMITTING PHYSICIAN: Gale Journey, MD  DATE OF DISCHARGE: 06/13/2015 PRIMARY CARE PHYSICIAN: No PCP Per Patient    ADMISSION DIAGNOSIS:  Stroke (cerebrum) (HCC) [I63.9] Acute CVA (cerebrovascular accident) (HCC) [I63.9]  DISCHARGE DIAGNOSIS:  Active Problems:   CVA (cerebral infarction)   SECONDARY DIAGNOSIS:  History reviewed. No pertinent past medical history.  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  TEE nml carotid Dopplers normal blood cultures NTD . Patient is afebrile.  hypercoagulability panel pending, pcp  to f/u PT recommending outpatient physical therapy Appreciate neurology consult for recurrent strokes in this young patient. Recs op holter or LINQ , need to follow-up with cardiology Dr. Vennie Homans in a week to arrange loop monitor  Denies IV drug use. Urine drug screen is positive for cannabinoids  #2 subjective fever with chills secondary to acute viral syndrome-resolved Chest x-rays negative, UA with yeast, start diflucan, blood cultures are NTD. No cardiac murmur noticed Echocardiogram with 55-60% ejection fraction TEE is ordered as there is a concern for embolic stroke-nml Concern for endocarditis but patient denies any IV drug use. Not febrile. Blood cultures are pending. No cardia murmur noticed. No leukocytosis either.  #3 dysphagia for few weeeeks PPI   Barium swallow nml Appreciate GI recommendations, outpatient follow-up with GI is recommended   #4 tobacco abuse  counseled patient to quit smoking for 3-5 minutes.  Agreeable. We'll start him on nicotine patch.     DISCHARGE CONDITIONS:   FAIR  CONSULTS OBTAINED:  Treatment Team:  Pauletta Browns, MD Alwyn Pea, MD Christena Deem, MD Dalia Heading, MD   PROCEDURES  TEE 06/12/2015 was normal Barium swallow 06/13/2015 is normal  DRUG ALLERGIES:  No Known Allergies  DISCHARGE MEDICATIONS:   Current Discharge Medication List    START taking these medications   Details  aspirin EC 81 MG tablet Take 1 tablet (81 mg total) by mouth daily.    atorvastatin (LIPITOR) 80 MG tablet Take 1 tablet (80 mg total) by mouth daily at 6 PM. Qty: 30 tablet, Refills: 0    clopidogrel (PLAVIX) 75 MG tablet Take 1 tablet (75 mg total) by mouth daily. Qty: 30 tablet, Refills: 0    nicotine (NICODERM CQ - DOSED IN MG/24 HOURS) 21 mg/24hr patch Place 1 patch (21 mg total) onto the skin daily. Qty: 28 patch, Refills: 0    pantoprazole (PROTONIX) 40 MG tablet Take 1 tablet (40 mg total) by mouth daily. Qty: 30 tablet, Refills: 0         DISCHARGE INSTRUCTIONS:   Diet healthy heart Activity as tolerated, as recommended by physical therapy Case management to arrange outpatient physical therapy Follow-up with primary care physician in 3 days Follow-up with cardiology Dr. Juliann Pares in a week for outpatient loop monitor Follow-up with neurology Dr. Sherryll Burger in a month Follow-up with gastroenterology Dr. Vilinda Blanks in a week   DIET:  Cardiac diet  DISCHARGE CONDITION:  Fair  ACTIVITY:  Activity as tolerated  OXYGEN:  Home Oxygen: No.   Oxygen Delivery: room air  DISCHARGE LOCATION:  home  If you experience worsening of your admission symptoms, develop shortness of breath, life threatening emergency, suicidal or homicidal thoughts you must seek medical attention immediately by calling 911 or calling your MD immediately  if symptoms less severe.  You Must read complete instructions/literature along with all the possible adverse  reactions/side effects for all the Medicines you take and that have been prescribed to you. Take any new Medicines after you have completely understood and accpet all the possible adverse reactions/side effects.   Please note  You were cared for by a hospitalist during your hospital stay. If you have any questions about your discharge medications or the care you received while you were in the hospital after you are discharged, you can call the unit and asked to speak with the hospitalist on call if the hospitalist that took care of you is not available. Once you are discharged, your primary care physician will handle any further medical issues. Please note that NO REFILLS for any discharge medications will be authorized once you are discharged, as it is imperative that you return to your primary care physician (or establish a relationship with a primary care physician if you do not have one) for your aftercare needs so that they can reassess your need for medications and monitor your lab values.     Today  Chief Complaint  Patient presents with  . Altered Mental Status   Patient is resting comfortably. Swallowing is significantly improved. Status post barium swallow which is normal  ROS:  CONSTITUTIONAL: Denies fevers, chills. Denies any fatigue, weakness.  EYES: Denies blurry vision, double vision, eye pain. EARS, NOSE, THROAT: Denies tinnitus, ear pain, hearing loss. RESPIRATORY: Denies cough, wheeze, shortness of breath.  CARDIOVASCULAR: Denies chest pain, palpitations, edema.  GASTROINTESTINAL: Denies nausea, vomiting, diarrhea, abdominal pain. Denies bright red blood per rectum. GENITOURINARY: Denies dysuria, hematuria. ENDOCRINE: Denies nocturia or thyroid problems. HEMATOLOGIC AND LYMPHATIC: Denies easy bruising or bleeding. SKIN: Denies rash or lesion. MUSCULOSKELETAL: Denies pain in neck, back, shoulder, knees, hips or arthritic symptoms.  NEUROLOGIC: Denies paralysis,  paresthesias.  PSYCHIATRIC: Denies anxiety or depressive symptoms.   VITAL SIGNS:  Blood pressure 134/58, pulse 55, temperature 97.6 F (36.4 C), temperature source Oral, resp. rate 19, height  (1.727 m), weight 76.295 kg (168 lb 3.2 oz), SpO2 100 %.  I/O:  No intake or output data in the 24 hours ending 06/13/15 1247  PHYSICAL EXAMINATION:  GENERAL:  39 y.o.-year-old patient lying in the bed with no acute distress.  EYES: Pupils equal, round, reactive to light and accommodation. No scleral icterus. Extraocular muscles intact.  HEENT: Head atraumatic, normocephalic. Oropharynx and nasopharynx clear.  NECK:  Supple, no jugular venous distention. No thyroid enlargement, no tenderness.  LUNGS: Normal breath sounds bilaterally, no wheezing, rales,rhonchi or crepitation. No use of accessory muscles of respiration.  CARDIOVASCULAR: S1, S2 normal. No murmurs, rubs, or gallops.  ABDOMEN: Soft, non-tender, non-distended. Bowel sounds present. No organomegaly or mass.  EXTREMITIES: No pedal edema, cyanosis, or clubbing.  NEUROLOGIC: Cranial nerves II through XII are intact. Muscle strength 5/5 in all extremities. Sensation intact. Gait not checked.  PSYCHIATRIC: The patient is alert and oriented x 3.  SKIN: No obvious rash, lesion, or ulcer.   DATA REVIEW:   CBC  Recent Labs Lab 06/11/15 0544  WBC 8.4  HGB 14.1  HCT 40.5  PLT 318    Chemistries   Recent Labs Lab 06/09/15 1026 06/10/15 0246  NA 142 141  K 4.3 3.7  CL 112* 111  CO2 24 24  GLUCOSE 107* 85  BUN 13 15  CREATININE 0.84 0.98  CALCIUM 9.4 9.0  AST 25  --   ALT 40  --   ALKPHOS 79  --   BILITOT 0.7  --     Cardiac Enzymes  Recent Labs Lab 06/10/15 1021  TROPONINI <0.03    Microbiology Results  Results for orders placed or performed during the hospital encounter of 06/09/15  Culture, blood (routine x 2)     Status: None (Preliminary result)   Collection Time: 06/09/15  3:46 PM  Result Value Ref  Range Status   Specimen Description BLOOD RIGHT FOREARM  Final   Special Requests BOTTLES DRAWN AEROBIC AND ANAEROBIC  3CC  Final   Culture NO GROWTH 4 DAYS  Final   Report Status PENDING  Incomplete  Culture, blood (routine x 2)     Status: None (Preliminary result)   Collection Time: 06/09/15  3:47 PM  Result Value Ref Range Status   Specimen Description BLOOD LEFT FOREARM  Final   Special Requests BOTTLES DRAWN AEROBIC AND ANAEROBIC  6CC  Final   Culture NO GROWTH 4 DAYS  Final   Report Status PENDING  Incomplete    RADIOLOGY:  Ct Angio Head W/cm &/or Wo Cm  06/10/2015  CLINICAL DATA:  Left pontine stroke EXAM: CT ANGIOGRAPHY HEAD AND NECK TECHNIQUE: Multidetector CT imaging of the head and neck was performed using the standard protocol during bolus administration of intravenous contrast. Multiplanar CT image reconstructions and MIPs were obtained to evaluate the vascular anatomy. Carotid stenosis measurements (when applicable) are obtained utilizing NASCET criteria, using the distal internal carotid diameter as the denominator. CONTRAST:  75mL OMNIPAQUE IOHEXOL 350 MG/ML SOLN COMPARISON:  MRI 06/09/2015 FINDINGS: CT HEAD Brain: Acute infarct left pons best seen on MRI. No hemorrhage. No other area of infarction. Ventricle size is normal. Calvarium and skull base: Negative Paranasal sinuses: Mucosal edema in the ethmoid sinuses bilaterally. No air-fluid level. Orbits: Negative CTA NECK Aortic arch: Normal aortic arch. Proximal great vessels widely patent. Lung apices clear. Right carotid system: Normal right carotid. No dissection or stenosis Left carotid system: Normal left carotid. No dissection or stenosis. Vertebral arteries:Both vertebral arteries widely patent. Right vertebral artery dominant. No dissection or stenosis in the vertebral arteries. Skeleton: No acute skeletal abnormality. Other neck: negative CTA HEAD Anterior circulation: Cavernous carotid widely patent bilaterally. No  evidence of atherosclerotic disease stenosis or aneurysm. Anterior and middle cerebral arteries widely patent bilaterally Posterior circulation: Both vertebral arteries patent to the basilar without stenosis. PICA patent bilaterally. Basilar widely patent without stenosis or thrombus. Superior cerebellar and posterior cerebral arteries patent bilaterally. Fetal origin right posterior cerebral artery with hypoplastic right P1 segment, a normal variant Venous sinuses: Patent Anatomic variants: None Delayed phase: Normal enhancement on delayed imaging. IMPRESSION: Acute infarct left pons, best seen on MRI. Normal CTA of the head and neck. Negative for atherosclerotic disease, dissection or thrombosis. Electronically Signed   By: Marlan Palau M.D.   On: 06/10/2015 15:34   Ct Angio Neck W/cm &/or Wo/cm  06/10/2015  CLINICAL DATA:  Left pontine stroke EXAM: CT ANGIOGRAPHY HEAD AND NECK TECHNIQUE: Multidetector CT imaging of the head and neck was performed using the standard protocol during bolus administration of intravenous contrast. Multiplanar CT image reconstructions and MIPs were obtained to evaluate the vascular anatomy. Carotid stenosis measurements (when applicable) are obtained utilizing NASCET criteria, using the distal internal carotid diameter as  the denominator. CONTRAST:  75mL OMNIPAQUE IOHEXOL 350 MG/ML SOLN COMPARISON:  MRI 06/09/2015 FINDINGS: CT HEAD Brain: Acute infarct left pons best seen on MRI. No hemorrhage. No other area of infarction. Ventricle size is normal. Calvarium and skull base: Negative Paranasal sinuses: Mucosal edema in the ethmoid sinuses bilaterally. No air-fluid level. Orbits: Negative CTA NECK Aortic arch: Normal aortic arch. Proximal great vessels widely patent. Lung apices clear. Right carotid system: Normal right carotid. No dissection or stenosis Left carotid system: Normal left carotid. No dissection or stenosis. Vertebral arteries:Both vertebral arteries widely patent.  Right vertebral artery dominant. No dissection or stenosis in the vertebral arteries. Skeleton: No acute skeletal abnormality. Other neck: negative CTA HEAD Anterior circulation: Cavernous carotid widely patent bilaterally. No evidence of atherosclerotic disease stenosis or aneurysm. Anterior and middle cerebral arteries widely patent bilaterally Posterior circulation: Both vertebral arteries patent to the basilar without stenosis. PICA patent bilaterally. Basilar widely patent without stenosis or thrombus. Superior cerebellar and posterior cerebral arteries patent bilaterally. Fetal origin right posterior cerebral artery with hypoplastic right P1 segment, a normal variant Venous sinuses: Patent Anatomic variants: None Delayed phase: Normal enhancement on delayed imaging. IMPRESSION: Acute infarct left pons, best seen on MRI. Normal CTA of the head and neck. Negative for atherosclerotic disease, dissection or thrombosis. Electronically Signed   By: Marlan Palauharles  Clark M.D.   On: 06/10/2015 15:34   Mr Laqueta JeanBrain W ZOWo Contrast  06/09/2015  CLINICAL DATA:  Slurred speech with right-sided weakness. EXAM: MRI HEAD WITHOUT AND WITH CONTRAST TECHNIQUE: Multiplanar, multiecho pulse sequences of the brain and surrounding structures were obtained without and with intravenous contrast. CONTRAST:  15mL MULTIHANCE GADOBENATE DIMEGLUMINE 529 MG/ML IV SOLN COMPARISON:  Head CT from earlier today FINDINGS: Calvarium and upper cervical spine: No focal marrow signal abnormality. Orbits: No significant findings. Sinuses and Mastoids: Sinusitis with mucosal thickening greatest and posterior right ethmoid air cell. Brain: There is restricted diffusion in the left pons consistent with acute infarct. More inferiorly, there is gliosis in the right pons consistent with remote infarct. No other ischemic changes in the remainder of the brain. Normal flow related signal loss in the visualized vertebral and basilar arteries. No abnormal intracranial  enhancement. No hemorrhage, hydrocephalus, or mass lesion. IMPRESSION: 1. Acute left pontine infarction. 2. Remote right pontine infarct. 3. No ischemic injury outside of the pons. Electronically Signed   By: Marnee SpringJonathon  Watts M.D.   On: 06/09/2015 14:43   Dg Esophagus  06/13/2015  CLINICAL DATA:  Stroke.  Dysphagia. EXAM: ESOPHOGRAM / BARIUM SWALLOW / BARIUM TABLET STUDY TECHNIQUE: Combined double contrast and single contrast examination performed using effervescent crystals, thick barium liquid, and thin barium liquid. The patient was observed with fluoroscopy swallowing a 13 mm barium sulphate tablet. FLUOROSCOPY TIME:  Radiation Exposure Index (as provided by the fluoroscopic device): 20 mGy COMPARISON:  None. FINDINGS: Cervical and thoracic esophagus are normal. Peristalsis normal. No obstructing lesion. No evidence of reflux. Standardized barium tablet passed normally . IMPRESSION: Normal exam. Electronically Signed   By: Maisie Fushomas  Register   On: 06/13/2015 09:37   [place] Carotid Bilateral  06/09/2015  CLINICAL DATA:  Stroke, trouble swallowing, difficulty walking, speech disturbance. History of smoking. EXAM: BILATERAL CAROTID DUPLEX ULTRASOUND TECHNIQUE: Wallace CullensGray scale imaging, color Doppler and duplex ultrasound were performed of bilateral carotid and vertebral arteries in the neck. COMPARISON:  Brain MRI -earlier same day; head CT -earlier same day FINDINGS: Criteria: Quantification of carotid stenosis is based on velocity parameters that correlate the residual internal  carotid diameter with NASCET-based stenosis levels, using the diameter of the distal internal carotid lumen as the denominator for stenosis measurement. The following velocity measurements were obtained: RIGHT ICA:  115/36 cm/sec CCA:  155/37 cm/sec SYSTOLIC ICA/CCA RATIO:  0.7 DIASTOLIC ICA/CCA RATIO:  0.7 ECA:  129 cm/sec LEFT ICA:  117/39 cm/sec CCA:  163/37 cm/sec SYSTOLIC ICA/CCA RATIO:  0.7 DIASTOLIC ICA/CCA RATIO:  1.1 ECA:  123 cm/sec  RIGHT CAROTID ARTERY: There is a minimal amount of eccentric mixed echogenic plaque within the right carotid bulb (images 16 and 17), extending to involve the origin and proximal aspect of the right internal carotid artery (image 24, not resulting in elevated peak systolic velocities within the interrogated course of the right internal carotid artery to suggest a hemodynamically significant stenosis. RIGHT VERTEBRAL ARTERY:  Antegrade flow LEFT CAROTID ARTERY: There is no grayscale evidence of significant intimal thickening or atherosclerotic plaque affecting the interrogated portions of the left carotid system. There are no elevated peak systolic velocities within the interrogated course of the left internal carotid artery to suggest a hemodynamically significant stenosis. LEFT VERTEBRAL ARTERY:  Antegrade flow IMPRESSION: 1. Very minimal amount of right-sided atherosclerotic plaque, not resulting in a hemodynamically significant stenosis. 2. Normal sonographic evaluation of the left carotid system. Electronically Signed   By: Simonne Come M.D.   On: 06/09/2015 16:43    EKG:   Orders placed or performed during the hospital encounter of 06/09/15  . ED EKG  . ED EKG  . EKG 12-Lead  . EKG 12-Lead      Management plans discussed with the patient, family and they are in agreement.  CODE STATUS:     Code Status Orders        Start     Ordered   06/09/15 1706  Full code   Continuous     06/09/15 1705      TOTAL TIME TAKING CARE OF THIS PATIENT: 45 minutes.    @  on 06/13/2015 at 12:47 PM  Between 7am to 6pm - Pager - [phone]  After 6pm go to [email or web address] - password [email or web address] [facility]  [facility] [facility]  Office  [phone]  CC: Primary care physician; No PCP Per Patient

## 2015-06-14 LAB — CULTURE, BLOOD (ROUTINE X 2)
CULTURE: NO GROWTH
CULTURE: NO GROWTH

## 2015-06-15 LAB — CARDIOLIPIN ANTIBODIES, IGG, IGM, IGA
Anticardiolipin IgA: <9 APL U/mL (ref 0–11)
Anticardiolipin IgG: <9 GPL U/mL (ref 0–14)
Anticardiolipin IgM: <9 MPL U/mL (ref 0–12)

## 2015-06-15 LAB — PROTHROMBIN GENE MUTATION

## 2015-06-15 LAB — BETA-2-GLYCOPROTEIN I ABS, IGG/M/A
Beta-2-Glycoprotein I IgA: <9 GPI IgA units (ref 0–25)
Beta-2-Glycoprotein I IgM: <9 GPI IgM units (ref 0–32)

## 2015-06-15 LAB — PROTEIN C, TOTAL: PROTEIN C, TOTAL: 97 % (ref 70–140)

## 2015-06-15 LAB — FACTOR 5 LEIDEN

## 2015-06-15 LAB — HOMOCYSTEINE: HOMOCYSTEINE-NORM: 7.5 umol/L (ref 0.0–15.0)

## 2015-06-15 LAB — PROTEIN S, TOTAL: PROTEIN S AG TOTAL: 129 % (ref 58–150)

## 2015-06-15 LAB — PROTEIN S ACTIVITY: Protein S Activity: 152 % — ABNORMAL HIGH (ref 60–145)

## 2015-06-15 LAB — LUPUS ANTICOAGULANT PANEL
DRVVT: 29.7 s (ref 0.0–55.1)
PTT Lupus Anticoagulant: 35.1 s (ref 0.0–50.0)

## 2015-06-15 LAB — PROTEIN C ACTIVITY: PROTEIN C ACTIVITY: 128 % (ref 74–151)

## 2017-02-07 ENCOUNTER — Telehealth: Payer: Self-pay | Admitting: Family Medicine

## 2017-02-07 NOTE — Telephone Encounter (Signed)
Patient wants to become established as a patient here.   [facility].   He now has ins. And wants to come here.  His Fiance Tommi Emeryngela Euliss is a patient here.   Steven Mclean has had 2 strokes in 2016 and is on medication due to that.     Will you take him on as a patient?

## 2017-02-07 NOTE — Telephone Encounter (Signed)
Trying to limit new patients to family members of present patients. Might be able to take on outside patients after Dr. B gets here and established (possibly September).

## 2017-03-20 ENCOUNTER — Encounter: Payer: Self-pay | Admitting: Emergency Medicine

## 2017-03-20 ENCOUNTER — Emergency Department: Payer: Managed Care, Other (non HMO)

## 2017-03-20 ENCOUNTER — Emergency Department
Admission: EM | Admit: 2017-03-20 | Discharge: 2017-03-20 | Disposition: A | Discharge status: Home / Self Care | Payer: Managed Care, Other (non HMO) | Attending: Emergency Medicine | Admitting: Emergency Medicine

## 2017-03-20 DIAGNOSIS — M549 Dorsalgia, unspecified: Secondary | ICD-10-CM | POA: Diagnosis present

## 2017-03-20 DIAGNOSIS — Z7902 Long term (current) use of antithrombotics/antiplatelets: Secondary | ICD-10-CM | POA: Insufficient documentation

## 2017-03-20 DIAGNOSIS — Z8673 Personal history of transient ischemic attack (TIA), and cerebral infarction without residual deficits: Secondary | ICD-10-CM | POA: Diagnosis not present

## 2017-03-20 DIAGNOSIS — Z87891 Personal history of nicotine dependence: Secondary | ICD-10-CM | POA: Diagnosis not present

## 2017-03-20 DIAGNOSIS — Z7982 Long term (current) use of aspirin: Secondary | ICD-10-CM | POA: Insufficient documentation

## 2017-03-20 DIAGNOSIS — N200 Calculus of kidney: Secondary | ICD-10-CM | POA: Diagnosis not present

## 2017-03-20 DIAGNOSIS — Z79899 Other long term (current) drug therapy: Secondary | ICD-10-CM | POA: Insufficient documentation

## 2017-03-20 HISTORY — DX: Cerebral infarction, unspecified: I63.9

## 2017-03-20 LAB — URINALYSIS, COMPLETE (UACMP) WITH MICROSCOPIC
BILIRUBIN URINE: NEGATIVE
Bacteria, UA: NONE SEEN
Glucose, UA: NEGATIVE mg/dL
Hgb urine dipstick: NEGATIVE
KETONES UR: NEGATIVE mg/dL
LEUKOCYTES UA: NEGATIVE
NITRITE: NEGATIVE
PH: 5 (ref 5.0–8.0)
Protein, ur: NEGATIVE mg/dL
RBC / HPF: NONE SEEN RBC/hpf (ref 0–5)
SPECIFIC GRAVITY, URINE: 1.013 (ref 1.005–1.030)

## 2017-03-20 MED ORDER — OXYCODONE-ACETAMINOPHEN 5-325 MG PO TABS: 1.0000 | ORAL_TABLET | Freq: Four times a day (QID) | ORAL | 0 refills | Status: DC | PRN

## 2017-03-20 NOTE — ED Notes (Signed)
C/o chronic low back pain for several months. Pt states last night it was hurting while trying to sleep. 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 Pt states he had a recent physical that was normal.

## 2017-03-20 NOTE — ED Triage Notes (Signed)
Back pain x 3 months nothing makes it better. Patient states it is worse with movement. Patient denies any nausea or urinary issues.

## 2017-03-20 NOTE — ED Provider Notes (Signed)
[facility] Emergency Department Provider Note   ____________________________________________   First MD Initiated Contact with Patient 03/20/17 [phone]     (approximate)  I have reviewed the triage vital signs and the nursing notes.   HISTORY  Chief Complaint Back Pain    HPI Rondall AllegraBarry L Fettig is a 41 y.o. male complaining of back pain for 3 months. Patient stated pain is worse with movement. Patient denies any radicular component to this back pain. Patient denies any bladder or bowel dysfunction. There is no nausea vomiting associated this complaint. Patient rates pain as a 6/10. Patient described a pain as "achy". 2016 patient had CVA and is currently taking Plavix. No paralysis mood for current complaint.   Past Medical History:  Diagnosis Date  . Stroke [facility](HCC) 05/2015    Patient Active Problem List   Diagnosis Date Noted  . CVA (cerebral infarction) 06/09/2015    Past Surgical History:  Procedure Laterality Date  . DENTAL SURGERY      Prior to Admission medications   Medication Sig Start Date End Date Taking? Authorizing Provider  aspirin EC 81 MG tablet Take 1 tablet (81 mg total) by mouth daily. 06/13/15   Ramonita LabGouru, Aruna, MD  atorvastatin (LIPITOR) 80 MG tablet Take 1 tablet (80 mg total) by mouth daily at 6 PM. 06/13/15   Gouru, Aruna, MD  clopidogrel (PLAVIX) 75 MG tablet Take 1 tablet (75 mg total) by mouth daily. 06/13/15   Gouru, Deanna ArtisAruna, MD  nicotine (NICODERM CQ - DOSED IN MG/24 HOURS) 21 mg/24hr patch Place 1 patch (21 mg total) onto the skin daily. 06/13/15   Gouru, Deanna ArtisAruna, MD  oxyCODONE-acetaminophen (ROXICET) 5-325 MG tablet Take 1 tablet by mouth every 6 (six) hours as needed. 03/20/17 03/20/18  Joni ReiningSmith, Girlie Veltri K, PA-C  pantoprazole (PROTONIX) 40 MG tablet Take 1 tablet (40 mg total) by mouth daily. 06/13/15   Ramonita LabGouru, Aruna, MD    Allergies Patient has no known allergies.  Family History  Problem Relation Age of Onset  . Deep vein  thrombosis Mother   . Stroke Other     Social History Social History  Substance Use Topics  . Smoking status: Former Smoker    Packs/day: 1.00    Types: Cigarettes    Quit date: 06/27/2015  . Smokeless tobacco: Not on file  . Alcohol use No    Review of Systems  Constitutional: No fever/chills Eyes: No visual changes. ENT: No sore throat. Cardiovascular: Denies chest pain. Respiratory: Denies shortness of breath. Gastrointestinal: No abdominal pain.  No nausea, no vomiting.  No diarrhea.  No constipation. Genitourinary: Negative for dysuria. Musculoskeletal: Negative for back pain. Skin: Negative for rash. Neurological: Negative for headaches, focal weakness or numbness. Endocrine:Hyperlipidemia ____________________________________________   PHYSICAL EXAM:  VITAL SIGNS: ED Triage Vitals  Enc Vitals Group     BP 03/20/17 0522 (!) 158/105     Pulse Rate 03/20/17 0522 62     Resp 03/20/17 0522 18     Temp 03/20/17 0522 98.2 F (36.8 C)     Temp Source 03/20/17 0522 Oral     SpO2 03/20/17 0522 99 %     Weight 03/20/17 0523 197 lb (89.4 kg)     Height 03/20/17 0523 5\' 8"  (1.727 m)     Head Circumference --      Peak Flow --      Pain Score 03/20/17 0522 6     Pain Loc --      Pain Edu? --  Excl. in GC? --     Constitutional: Alert and oriented. Well appearing and in no acute distress. Cardiovascular: Normal rate, regular rhythm. Grossly normal heart sounds.  Good peripheral circulation. Respiratory: Normal respiratory effort.  No retractions. Lungs CTAB. Gastrointestinal: Soft and nontender. No distention. No abdominal bruits. Right CVA tenderness. Musculoskeletal: No obvious deformity to the lumbar spine. Patient has moderate guarding palpation L4-S1. Patient has paraspinal muscle spasms on the right side with left lateral movements. Patient has negative straight leg test. Patient has normal gait. No lower extremity tenderness nor edema.  No joint  effusions. Neurologic:  Normal speech and language. No gross focal neurologic deficits are appreciated. No gait instability. Skin:  Skin is warm, dry and intact. No rash noted. Psychiatric: Mood and affect are normal. Speech and behavior are normal.  ____________________________________________   LABS (all labs ordered are listed, but only abnormal results are displayed)  Labs Reviewed  URINALYSIS, COMPLETE (UACMP) WITH MICROSCOPIC - Abnormal; Notable for the following:       Result Value   Color, Urine YELLOW (*)    APPearance CLEAR (*)    Squamous Epithelial / LPF 0-5 (*)    All other components within normal limits   ____________________________________________  EKG   ____________________________________________  RADIOLOGY  Dg Lumbar Spine Complete  Result Date: 03/20/2017 CLINICAL DATA:  Right flank pain. EXAM: LUMBAR SPINE - COMPLETE 4+ VIEW COMPARISON:  No recent prior . FINDINGS: Mild scoliosis concave right. No acute bony abnormality identified. Old healed right lower rib fracture. No evidence of fracture. Punctate 2 mm right renal calcific density noted suggesting tiny right renal stone. Punctate 2 mm calcific density in the right lower pelvis most likely a phlebolith. 2 mm distal right ureteral stone cannot be completely excluded . IMPRESSION: 1. Mild scoliosis concave right. No acute bony abnormality. Old healed right lower rib fracture. 2. Punctate 2 mm probable right renal stone . Punctate 2 mm calcification in the right lower pelvis most likely phleboliths. Distal right ureteral stone cannot be completely excluded . Electronically Signed   By: Maisie Fushomas  Register   On: 03/20/2017 07:51    ____________________________________________   PROCEDURES  Procedure(s) performed: None  Procedures  Critical Care performed: No  ____________________________________________   INITIAL IMPRESSION / ASSESSMENT AND PLAN / ED COURSE  Pertinent labs & imaging results that were  available during my care of the patient were reviewed by me and considered in my medical decision making (see chart for details).  Right flank pain secondary to renal calculi. Discuss x-ray finding with patient. Patient given discharge care instructions. Patient advised follow-up with urologist if no improvement in one week. Return to ED if condition worsens.      ____________________________________________   FINAL CLINICAL IMPRESSION(S) / ED DIAGNOSES  Final diagnoses:  Kidney stone on right side      NEW MEDICATIONS STARTED DURING THIS VISIT:  New Prescriptions   OXYCODONE-ACETAMINOPHEN (ROXICET) 5-325 MG TABLET    Take 1 tablet by mouth every 6 (six) hours as needed.     Note:  This document was prepared using Dragon voice recognition software and may include unintentional dictation errors.    Joni ReiningSmith, Zan Orlick K, PA-C 03/20/17 [id number]    Sharman CheekStafford, Phillip, MD 03/21/17 2018

## 2017-03-27 ENCOUNTER — Other Ambulatory Visit: Payer: Self-pay | Admitting: Otolaryngology

## 2017-03-27 DIAGNOSIS — R1310 Dysphagia, unspecified: Secondary | ICD-10-CM

## 2017-04-01 ENCOUNTER — Ambulatory Visit: Payer: Managed Care, Other (non HMO) | Attending: Otolaryngology

## 2017-04-10 ENCOUNTER — Ambulatory Visit
Admission: RE | Admit: 2017-04-10 | Discharge: 2017-04-10 | Disposition: A | Discharge status: Home / Self Care | Payer: Managed Care, Other (non HMO) | Source: Ambulatory Visit | Attending: Otolaryngology | Admitting: Otolaryngology

## 2017-04-10 DIAGNOSIS — R1310 Dysphagia, unspecified: Secondary | ICD-10-CM | POA: Insufficient documentation

## 2017-04-10 DIAGNOSIS — K219 Gastro-esophageal reflux disease without esophagitis: Secondary | ICD-10-CM | POA: Insufficient documentation

## 2017-04-10 DIAGNOSIS — K449 Diaphragmatic hernia without obstruction or gangrene: Secondary | ICD-10-CM | POA: Insufficient documentation

## 2017-05-19 ENCOUNTER — Emergency Department
Admission: EM | Admit: 2017-05-19 | Discharge: 2017-05-19 | Disposition: A | Discharge status: Home / Self Care | Payer: Managed Care, Other (non HMO) | Attending: Student in an Organized Health Care Education/Training Program | Admitting: Student in an Organized Health Care Education/Training Program

## 2017-05-19 ENCOUNTER — Encounter: Payer: Self-pay | Admitting: Emergency Medicine

## 2017-05-19 DIAGNOSIS — B349 Viral infection, unspecified: Secondary | ICD-10-CM | POA: Insufficient documentation

## 2017-05-19 DIAGNOSIS — Z87891 Personal history of nicotine dependence: Secondary | ICD-10-CM | POA: Insufficient documentation

## 2017-05-19 DIAGNOSIS — Z79899 Other long term (current) drug therapy: Secondary | ICD-10-CM | POA: Insufficient documentation

## 2017-05-19 DIAGNOSIS — Z7902 Long term (current) use of antithrombotics/antiplatelets: Secondary | ICD-10-CM | POA: Insufficient documentation

## 2017-05-19 DIAGNOSIS — Z8673 Personal history of transient ischemic attack (TIA), and cerebral infarction without residual deficits: Secondary | ICD-10-CM | POA: Diagnosis not present

## 2017-05-19 DIAGNOSIS — J029 Acute pharyngitis, unspecified: Secondary | ICD-10-CM | POA: Diagnosis present

## 2017-05-19 DIAGNOSIS — N390 Urinary tract infection, site not specified: Secondary | ICD-10-CM | POA: Insufficient documentation

## 2017-05-19 DIAGNOSIS — J069 Acute upper respiratory infection, unspecified: Secondary | ICD-10-CM

## 2017-05-19 DIAGNOSIS — Z7982 Long term (current) use of aspirin: Secondary | ICD-10-CM | POA: Diagnosis not present

## 2017-05-19 LAB — INFLUENZA PANEL BY PCR (TYPE A & B)
Influenza A By PCR: NEGATIVE
Influenza B By PCR: NEGATIVE

## 2017-05-19 NOTE — ED Notes (Addendum)
Pt c/o body aches and sore throat since yesterday, unaware if febrile at home. Pt  In NAD at this time

## 2017-05-19 NOTE — ED Provider Notes (Signed)
[facility] Emergency Department Provider Note  ____________________________________________  Time seen: Approximately 4:04 PM  I have reviewed the triage vital signs and the nursing notes.   HISTORY  Chief Complaint Sore Throat    HPI Steven Mclean is a 41 y.o. male presenting to the emergency department with pharyngitis a profound myalgias and diarrhea for the past 2 days. Patient states that his entire body hurts. Patient that the symptoms are atypical for him. Patient is concerned that he has the flu. He has had intermittent rhinorrhea and congestion and the start of a nonproductive cough. Patient denies chest pain, chest tightness, nausea, vomiting or abdominal pain. No alleviating measures have been attempted.   Past Medical History:  Diagnosis Date  . Stroke [facility]) 05/2015    Patient Active Problem List   Diagnosis Date Noted  . CVA (cerebral infarction) 06/09/2015    Past Surgical History:  Procedure Laterality Date  . DENTAL SURGERY      Prior to Admission medications   Medication Sig Start Date End Date Taking? Authorizing Provider  aspirin EC 81 MG tablet Take 1 tablet (81 mg total) by mouth daily. 06/13/15   Ramonita Lab, MD  atorvastatin (LIPITOR) 80 MG tablet Take 1 tablet (80 mg total) by mouth daily at 6 PM. 06/13/15   Gouru, Aruna, MD  clopidogrel (PLAVIX) 75 MG tablet Take 1 tablet (75 mg total) by mouth daily. 06/13/15   Gouru, Deanna Artis, MD  nicotine (NICODERM CQ - DOSED IN MG/24 HOURS) 21 mg/24hr patch Place 1 patch (21 mg total) onto the skin daily. 06/13/15   Gouru, Deanna Artis, MD  oxyCODONE-acetaminophen (ROXICET) 5-325 MG tablet Take 1 tablet by mouth every 6 (six) hours as needed. 03/20/17 03/20/18  Joni Reining, PA-C  pantoprazole (PROTONIX) 40 MG tablet Take 1 tablet (40 mg total) by mouth daily. 06/13/15   Ramonita Lab, MD    Allergies Patient has no known allergies.  Family History  Problem Relation Age of Onset  . Deep vein  thrombosis Mother   . Stroke Other     Social History Social History  Substance Use Topics  . Smoking status: Former Smoker    Packs/day: 1.00    Types: Cigarettes    Quit date: 06/27/2015  . Smokeless tobacco: Never Used  . Alcohol use No     Review of Systems  Constitutional: Patient has had fever.  Eyes: No visual changes. No discharge ENT: Patient has had congestion.  Cardiovascular: no chest pain. Respiratory: Patient has had non-productive cough.  No SOB. Gastrointestinal: Patient has had nausea.  Genitourinary: Negative for dysuria. No hematuria Musculoskeletal: Patient has had myalgias. Skin: Negative for rash, abrasions, lacerations, ecchymosis. Neurological: Negative for headaches, focal weakness or numbness.   ____________________________________________   PHYSICAL EXAM:  VITAL SIGNS: ED Triage Vitals  Enc Vitals Group     BP 05/19/17 1443 130/66     Pulse Rate 05/19/17 1442 72     Resp 05/19/17 1442 16     Temp 05/19/17 1442 99 F (37.2 C)     Temp Source 05/19/17 1442 Oral     SpO2 05/19/17 1442 99 %     Weight 05/19/17 1442 190 lb (86.2 kg)     Height 05/19/17 1442  (1.727 m)     Head Circumference --      Peak Flow --      Pain Score 05/19/17 1517 0     Pain Loc --      Pain  Edu? --      Excl. in GC? --     Constitutional: Alert and oriented. Patient is lying supine in bed.  Eyes: Conjunctivae are normal. PERRL. EOMI. Head: Atraumatic. ENT:      Ears: Tympanic membranes are injected bilaterally without evidence of effusion or purulent exudate. Bony landmarks are visualized bilaterally. No pain with palpation at the tragus.      Nose: Nasal turbinates are edematous and erythematous. Copious rhinorrhea visualized.      Mouth/Throat: Mucous membranes are moist. Posterior pharynx is mildly erythematous. No tonsillar hypertrophy or purulent exudate. Uvula is midline. Neck: Full range of motion. No pain is elicited with flexion at the  neck. Hematological/Lymphatic/Immunilogical: No cervical lymphadenopathy. Cardiovascular: Normal rate, regular rhythm. Normal S1 and S2.  Good peripheral circulation. Respiratory: Normal respiratory effort without tachypnea or retractions. Lungs CTAB. Good air entry to the bases with no decreased or absent breath sounds. Gastrointestinal: Bowel sounds 4 quadrants. Soft and nontender to palpation. No guarding or rigidity. No palpable masses. No distention. No CVA tenderness.  Skin:  Skin is warm, dry and intact. No rash noted. Psychiatric: Mood and affect are normal. Speech and behavior are normal. Patient exhibits appropriate insight and judgement.  ____________________________________________   LABS (all labs ordered are listed, but only abnormal results are displayed)  Labs Reviewed  INFLUENZA PANEL BY PCR (TYPE A & B)   ____________________________________________  EKG   ____________________________________________  RADIOLOGY   No results found.  ____________________________________________    PROCEDURES  Procedure(s) performed:    Procedures    Medications - No data to display   ____________________________________________   INITIAL IMPRESSION / ASSESSMENT AND PLAN / ED COURSE  Pertinent labs & imaging results that were available during my care of the patient were reviewed by me and considered in my medical decision making (see chart for details).  Review of the [place] CSRS was performed in accordance of the [employer] prior to dispensing any controlled drugs.    Assessment and Plan: Viral URI Patient presents to the emergency department with rhinorrhea, congestion and nonproductive cough as well as myalgias for the past 2 days. History and physical exam findings are consistent with viral upper respiratory tract infection. Rest and hydration were encouraged. Patient was advised to follow-up with primary care as needed. A work note was provided. All patient questions  were answered.   ____________________________________________  FINAL CLINICAL IMPRESSION(S) / ED DIAGNOSES  Final diagnoses:  None      NEW MEDICATIONS STARTED DURING THIS VISIT:  New Prescriptions   No medications on file        This chart was dictated using voice recognition software/Dragon. Despite best efforts to proofread, errors can occur which can change the meaning. Any change was purely unintentional.    Gasper Lloyd 05/19/17 2227    Willy Eddy, MD 05/19/17 2350

## 2017-05-19 NOTE — ED Notes (Signed)
E-signature pad not working. Patient declined to wait for paper copy.

## 2017-05-19 NOTE — ED Triage Notes (Signed)
Patient presents to ED via POV from home.Patient states, "i think I have the flu". Patient reports fatigue and sore throat.

## 2017-05-19 NOTE — ED Notes (Signed)
Patient declined VS at this time.

## 2017-06-11 ENCOUNTER — Ambulatory Visit: Payer: Managed Care, Other (non HMO) | Admitting: Gastroenterology

## 2017-06-16 ENCOUNTER — Encounter: Payer: Self-pay | Admitting: Gastroenterology

## 2017-06-16 ENCOUNTER — Ambulatory Visit (INDEPENDENT_AMBULATORY_CARE_PROVIDER_SITE_OTHER): Payer: Managed Care, Other (non HMO) | Admitting: Gastroenterology

## 2017-06-16 VITALS — BP 124/90 | HR 91 | Temp 98.0°F | Ht 68.0 in | Wt 196.0 lb

## 2017-06-16 DIAGNOSIS — R1310 Dysphagia, unspecified: Secondary | ICD-10-CM

## 2017-06-16 NOTE — Addendum Note (Signed)
Addended by: Ardyth ManARTER, Aubrina Nieman Z on: 06/16/2017 04:14 PM   Modules accepted: Orders

## 2017-06-16 NOTE — Addendum Note (Signed)
Addended by: Ardyth ManARTER, Katerine Morua Z on: 06/16/2017 03:53 PM   Modules accepted: Orders, SmartSet

## 2017-06-16 NOTE — Progress Notes (Signed)
Steven MoodKiran Natalynn Pedone MD, MRCP(U.K) [address]  [address]  [place], [place] [address]  Main: [phone]  Fax: [phone]   Gastroenterology Consultation  Referring Provider:     Bud FaceVaught, Creighton, MD [facility] Phys[facility]  [facility] Primary Gastroenterologist:  Dr. Wyline MoodKiran Eziah Mclean  Reason for Consultation:     GERD        HPI:   Steven Mclean is a 41 y.o. y/o male . He has been referred for GERD.   Barium swallow on 04/10/17 showed small hiatal hernia, moderate GERD. No stricture. He says that he has been referred by his ENT doctor for his hiatal hernia. Says he has pain while swallowing , ongoing for a few months, on and off. Began after his stroke 2 years back . He was a smoker which he quit. In the past had food stuck in his throat. Denies any heartburn. He says he was started on Protonix. He still takes it at night . Denies any weight loss. Denies any family history of esophageal cancer/   Presently when he swallows has no pain , has difficulty swallowing , Takes 2-3 attempts to get the food down , sometimes food goes the wrong way . He takes plavix. He does have issues with balance since the stroke   Past Medical History:  Diagnosis Date  . Stroke [facility](HCC) 05/2015    Past Surgical History:  Procedure Laterality Date  . DENTAL SURGERY      Prior to Admission medications   Medication Sig Start Date End Date Taking? Authorizing Provider  aspirin EC 81 MG tablet Take 1 tablet (81 mg total) by mouth daily. 06/13/15   Ramonita LabGouru, Aruna, MD  atorvastatin (LIPITOR) 80 MG tablet Take 1 tablet (80 mg total) by mouth daily at 6 PM. 06/13/15   Gouru, Aruna, MD  clopidogrel (PLAVIX) 75 MG tablet Take 1 tablet (75 mg total) by mouth daily. 06/13/15   Gouru, Deanna ArtisAruna, MD  FLUCELVAX QUADRIVALENT 0.5 ML SUSY TO BE ADMINISTERED BY PHARMACIST FOR IMMUNIZATION 06/07/17   [provider]  nicotine (NICODERM CQ - DOSED IN MG/24 HOURS) 21 mg/24hr patch Place 1 patch  (21 mg total) onto the skin daily. 06/13/15   Gouru, Deanna ArtisAruna, MD  oxyCODONE-acetaminophen (ROXICET) 5-325 MG tablet Take 1 tablet by mouth every 6 (six) hours as needed. 03/20/17 03/20/18  Joni ReiningSmith, Ronald K, PA-C  pantoprazole (PROTONIX) 40 MG tablet Take 1 tablet (40 mg total) by mouth daily. 06/13/15   Ramonita LabGouru, Aruna, MD    Family History  Problem Relation Age of Onset  . Deep vein thrombosis Mother   . Stroke Other      Social History  Substance Use Topics  . Smoking status: Former Smoker    Packs/day: 1.00    Types: Cigarettes    Quit date: 06/27/2015  . Smokeless tobacco: Never Used  . Alcohol use No    Allergies as of 06/16/2017  . (No Known Allergies)    Review of Systems:    All systems reviewed and negative except where noted in HPI.   Physical Exam:  There were no vitals taken for this visit. No LMP for male patient. Psych:  Alert and cooperative. Normal Mclean and affect. General:   Alert,  Well-developed, well-nourished, pleasant and cooperative in NAD Head:  Normocephalic and atraumatic. Eyes:  Sclera clear, no icterus.   Conjunctiva pink. Ears:  Normal auditory acuity. Nose:  No deformity, discharge, or lesions. Mouth:  No deformity  or lesions,oropharynx pink & moist. Neck:  Supple; no masses or thyromegaly. Lungs:  Respirations even and unlabored.  Clear throughout to auscultation.   No wheezes, crackles, or rhonchi. No acute distress. Heart:  Regular rate and rhythm; no murmurs, clicks, rubs, or gallops. Abdomen:  Normal bowel sounds.  No bruits.  Soft, non-tender and non-distended without masses, hepatosplenomegaly or hernias noted.  No guarding or rebound tenderness.    Neurologic:  Alert and oriented x3;  grossly normal neurologically. Skin:  Intact without significant lesions or rashes. No jaundice. Lymph Nodes:  No significant cervical adenopathy. Psych:  Alert and cooperative. Normal Mclean and affect.  Imaging Studies: No results found.  Assessment and  Plan:   Steven Mclean is a 41 y.o. y/o male has been referred for GERD. His history suggests odynophagia, dysphagia. He may have a transfer dysphagia from the stroke.   Plan  1. Modified barium swallow 2. EGD to r/o EOE,candida +/- dilation  3. Take his PPI in the morning on empty stomach rather than at night after a meal 4. Plavix holding instructions   I have discussed alternative options, risks & benefits,  which include, but are not limited to, bleeding, infection, perforation,respiratory complication & drug reaction.  The patient agrees with this plan & written consent will be obtained.    Follow up in 8 weeks   Dr Steven Mood MD,MRCP(U.K)

## 2017-06-26 ENCOUNTER — Telehealth: Payer: Self-pay

## 2017-06-26 NOTE — Telephone Encounter (Signed)
-----   Message from Wyline MoodKiran Anna, MD sent at 06/17/2017  9:55 AM EDT ----- Regarding: RE: Swallow Study He needs a modified barium swallow which is different from the normal barium he had recentl;y     ----- Message ----- From: Ethlyn Galleryarter, Elohim Brune, CMA Sent: 06/16/2017   4:19 PM To: Wyline MoodKiran Anna, MD Subject: Swallow Study                                  The patient wants to know if he needs another swallow study.   Last study was 04/10/17.  Thanks

## 2017-06-26 NOTE — Telephone Encounter (Signed)
LVM for patient callback to schedule swallow study per Dr. Johnney KillianAnna's response to patient question.  He needs a modified barium swallow which is different from the normal barium he had recentl;y

## 2017-07-07 ENCOUNTER — Encounter: Payer: Self-pay | Admitting: Anesthesiology

## 2017-07-07 ENCOUNTER — Ambulatory Visit
Admission: RE | Admit: 2017-07-07 | Payer: Managed Care, Other (non HMO) | Source: Ambulatory Visit | Admitting: Gastroenterology

## 2017-07-07 ENCOUNTER — Encounter: Admission: RE | Payer: Self-pay | Source: Ambulatory Visit

## 2017-07-07 SURGERY — ESOPHAGOGASTRODUODENOSCOPY (EGD) WITH PROPOFOL
Anesthesia: General

## 2017-08-23 ENCOUNTER — Encounter: Payer: Self-pay | Admitting: Emergency Medicine

## 2017-08-23 ENCOUNTER — Emergency Department: Payer: Self-pay

## 2017-08-23 ENCOUNTER — Emergency Department
Admission: EM | Admit: 2017-08-23 | Discharge: 2017-08-23 | Disposition: A | Discharge status: Home / Self Care | Payer: Self-pay | Attending: Emergency Medicine | Admitting: Emergency Medicine

## 2017-08-23 ENCOUNTER — Other Ambulatory Visit: Payer: Self-pay

## 2017-08-23 DIAGNOSIS — Z7982 Long term (current) use of aspirin: Secondary | ICD-10-CM | POA: Insufficient documentation

## 2017-08-23 DIAGNOSIS — Y636 Underdosing and nonadministration of necessary drug, medicament or biological substance: Secondary | ICD-10-CM | POA: Insufficient documentation

## 2017-08-23 DIAGNOSIS — Z91128 Patient's intentional underdosing of medication regimen for other reason: Secondary | ICD-10-CM | POA: Insufficient documentation

## 2017-08-23 DIAGNOSIS — R7989 Other specified abnormal findings of blood chemistry: Secondary | ICD-10-CM | POA: Insufficient documentation

## 2017-08-23 DIAGNOSIS — R1011 Right upper quadrant pain: Secondary | ICD-10-CM | POA: Insufficient documentation

## 2017-08-23 DIAGNOSIS — R410 Disorientation, unspecified: Secondary | ICD-10-CM | POA: Insufficient documentation

## 2017-08-23 DIAGNOSIS — Z8673 Personal history of transient ischemic attack (TIA), and cerebral infarction without residual deficits: Secondary | ICD-10-CM | POA: Insufficient documentation

## 2017-08-23 DIAGNOSIS — T45526A Underdosing of antithrombotic drugs, initial encounter: Secondary | ICD-10-CM | POA: Insufficient documentation

## 2017-08-23 DIAGNOSIS — Z79899 Other long term (current) drug therapy: Secondary | ICD-10-CM | POA: Insufficient documentation

## 2017-08-23 DIAGNOSIS — Z7902 Long term (current) use of antithrombotics/antiplatelets: Secondary | ICD-10-CM | POA: Insufficient documentation

## 2017-08-23 DIAGNOSIS — Z87891 Personal history of nicotine dependence: Secondary | ICD-10-CM | POA: Insufficient documentation

## 2017-08-23 LAB — URINE DRUG SCREEN, QUALITATIVE (ARMC ONLY)
AMPHETAMINES, UR SCREEN: NOT DETECTED
BENZODIAZEPINE, UR SCRN: NOT DETECTED
Barbiturates, Ur Screen: NOT DETECTED
CANNABINOID 50 NG, UR ~~LOC~~: POSITIVE — AB
Cocaine Metabolite,Ur ~~LOC~~: NOT DETECTED
MDMA (Ecstasy)Ur Screen: NOT DETECTED
Methadone Scn, Ur: NOT DETECTED
OPIATE, UR SCREEN: NOT DETECTED
PHENCYCLIDINE (PCP) UR S: NOT DETECTED
Tricyclic, Ur Screen: NOT DETECTED

## 2017-08-23 LAB — CBC
HCT: 40.3 % (ref 40.0–52.0)
Hemoglobin: 13.6 g/dL (ref 13.0–18.0)
MCH: 30.6 pg (ref 26.0–34.0)
MCHC: 33.8 g/dL (ref 32.0–36.0)
MCV: 90.4 fL (ref 80.0–100.0)
Platelets: 302 10*3/uL (ref 150–440)
RBC: 4.45 MIL/uL (ref 4.40–5.90)
RDW: 14.1 % (ref 11.5–14.5)
WBC: 9.6 10*3/uL (ref 3.8–10.6)

## 2017-08-23 LAB — URINALYSIS, COMPLETE (UACMP) WITH MICROSCOPIC
Bacteria, UA: NONE SEEN
Bilirubin Urine: NEGATIVE
Glucose, UA: NEGATIVE mg/dL
Hgb urine dipstick: NEGATIVE
Ketones, ur: NEGATIVE mg/dL
Leukocytes, UA: NEGATIVE
Nitrite: NEGATIVE
Protein, ur: NEGATIVE mg/dL
Specific Gravity, Urine: 1.024 (ref 1.005–1.030)
pH: 6 (ref 5.0–8.0)

## 2017-08-23 LAB — COMPREHENSIVE METABOLIC PANEL
ALK PHOS: 97 U/L (ref 38–126)
ALT: 57 U/L (ref 17–63)
ANION GAP: 7 (ref 5–15)
AST: 33 U/L (ref 15–41)
Albumin: 4 g/dL (ref 3.5–5.0)
BILIRUBIN TOTAL: 0.7 mg/dL (ref 0.3–1.2)
BUN: 14 mg/dL (ref 6–20)
CALCIUM: 9 mg/dL (ref 8.9–10.3)
CO2: 24 mmol/L (ref 22–32)
Chloride: 106 mmol/L (ref 101–111)
Creatinine, Ser: 1.23 mg/dL (ref 0.61–1.24)
GFR calc Af Amer: >60 mL/min (ref >60)
Glucose, Bld: 114 mg/dL — ABNORMAL HIGH (ref 65–99)
POTASSIUM: 3.8 mmol/L (ref 3.5–5.1)
Sodium: 137 mmol/L (ref 135–145)
TOTAL PROTEIN: 6.9 g/dL (ref 6.5–8.1)

## 2017-08-23 LAB — ETHANOL

## 2017-08-23 LAB — AMMONIA: Ammonia: 38 umol/L — ABNORMAL HIGH (ref 9–35)

## 2017-08-23 LAB — LIPASE, BLOOD: Lipase: 58 U/L — ABNORMAL HIGH (ref 11–51)

## 2017-08-23 MED ORDER — CLOPIDOGREL BISULFATE 75 MG PO TABS: 75.0000 mg | ORAL_TABLET | Freq: Every day | ORAL | 1 refills | Status: AC

## 2017-08-23 MED ORDER — ASPIRIN EC 81 MG PO TBEC: 81.0000 mg | DELAYED_RELEASE_TABLET | Freq: Every day | ORAL | 1 refills | Status: AC

## 2017-08-23 NOTE — ED Notes (Signed)
Pt sitting on end of bed; alert and oriented x 3; talking in complete coherent sentences; pt says he's ready to go home; will notify MD of same

## 2017-08-23 NOTE — ED Triage Notes (Signed)
Pt to ED via POV c/o episodes of confusion. Pt states that he has hx/o CVA. For the past week he has been having periods where he feels more confused and also had an episode where he felt "intoxicated". Pt states that he drove himself to the ER. Pt states that he has had a nervous break down at work twice the past week "thinking of the way things used to be". Pt also c/o abdominal pain Pt tearful in triage.

## 2017-08-23 NOTE — ED Notes (Signed)
Patient transported to Ultrasound 

## 2017-08-23 NOTE — ED Notes (Signed)
Dr Veronese in to followup  

## 2017-08-23 NOTE — ED Provider Notes (Signed)
[facility] Emergency Department Provider Note  ____________________________________________  Time seen: Approximately 8:54 PM  I have reviewed the triage vital signs and the nursing notes.   HISTORY  Chief Complaint Altered Mental Status and Abdominal Pain   HPI Steven Mclean is a 41 y.o. male with a history of stroke 2 years ago who presents for evaluation of altered mental status. Patient reports that a month ago he decided to stop all of his medications which included a statin, Plavix, and aspirin because the medications were "killing his liver and his gallbladder". Patient reports that since being placed on these medications after his CVA in 2016, he has intermittent right upper quadrant abdominal pain that he describes as a stabbing sharp pain that, lasts minutes at a time and resolves with no intervention. Last time he had pain was several days ago and he has no pain at this time. A month ago he decided that the culprit of this abdominal pain was his medications and he stopped taking them. Over the course of the last week patient reports that he has been having several episodes on a daily basis of confusion, disorientation, not knowing where he is, and short-lived episodes of vertigo that he describes as lasting 2-3 seconds at a time and feeling like he is drunk. He is afraid that these episodes are strokes. No headaches, no changes in vision, no facial droop, no slurred speech, no unilateral weakness or numbness. Patient has no deficits from his prior stroke which presented with facial droop, right upper extremity weakness and slurred speech. He denies drugs, alcohol or smoking. Patient also reports mood swings and has been crying easily for no reason. He denies suicidal or homicidal ideation or history of depression.  Past Medical History:  Diagnosis Date  . Stroke [facility](HCC) 05/2015    Patient Active Problem List   Diagnosis Date Noted  . CVA (cerebral  infarction) 06/09/2015    Past Surgical History:  Procedure Laterality Date  . DENTAL SURGERY      Prior to Admission medications   Medication Sig Start Date End Date Taking? Authorizing Provider  aspirin EC 81 MG tablet Take 1 tablet (81 mg total) by mouth daily. 08/23/17   Nita SickleVeronese, [place], MD  atorvastatin (LIPITOR) 80 MG tablet Take 1 tablet (80 mg total) by mouth daily at 6 PM. 06/13/15   Gouru, Aruna, MD  clopidogrel (PLAVIX) 75 MG tablet Take 1 tablet (75 mg total) by mouth daily. 08/23/17   Nita SickleVeronese, [place], MD    Allergies Patient has no known allergies.  Family History  Problem Relation Age of Onset  . Deep vein thrombosis Mother   . Stroke Other     Social History Social History   Tobacco Use  . Smoking status: Former Smoker    Packs/day: 1.00    Types: Cigarettes    Last attempt to quit: 06/27/2015    Years since quitting: 2.1  . Smokeless tobacco: Never Used  Substance Use Topics  . Alcohol use: No  . Drug use: No    Review of Systems  Constitutional: Negative for fever. Eyes: Negative for visual changes. ENT: Negative for sore throat. Neck: No neck pain  Cardiovascular: Negative for chest pain. Respiratory: Negative for shortness of breath. Gastrointestinal: Negative for abdominal pain, vomiting or diarrhea. Genitourinary: Negative for dysuria. Musculoskeletal: Negative for back pain. Skin: Negative for rash. Neurological: Negative for headaches, weakness or numbness. + confusion, disorientation, vertigo Psych: No SI or HI  ____________________________________________  PHYSICAL EXAM:  VITAL SIGNS: ED Triage Vitals  Enc Vitals Group     BP 08/23/17 1620 (!) 141/81     Pulse Rate 08/23/17 1620 79     Resp 08/23/17 1620 16     Temp 08/23/17 1620 98.6 F (37 C)     Temp Source 08/23/17 1620 Oral     SpO2 08/23/17 1620 98 %     Weight 08/23/17 1621 200 lb (90.7 kg)     Height 08/23/17 1621 5\' 8"  (1.727 m)     Head Circumference --        Peak Flow --      Pain Score 08/23/17 1620 10     Pain Loc --      Pain Edu? --      Excl. in GC? --     Constitutional: Alert and oriented. Well appearing and in no apparent distress. HEENT:      Head: Normocephalic and atraumatic.         Eyes: Conjunctivae are normal. Sclera is non-icteric.       Mouth/Throat: Mucous membranes are moist.       Neck: Supple with no signs of meningismus. No bruits Cardiovascular: Regular rate and rhythm. No murmurs, gallops, or rubs. 2+ symmetrical distal pulses are present in all extremities. No JVD. Respiratory: Normal respiratory effort. Lungs are clear to auscultation bilaterally. No wheezes, crackles, or rhonchi.  Gastrointestinal: Soft, non tender, and non distended with positive bowel sounds. No rebound or guarding. Musculoskeletal: Nontender with normal range of motion in all extremities. No edema, cyanosis, or erythema of extremities. Neurologic: Normal speech and language. A & O x3, PERRL, EOMI, no nystagmus, CN II-XII intact, motor testing reveals good tone and bulk throughout. There is no evidence of pronator drift or dysmetria. Muscle strength is 5/5 throughout.  Sensory examination is intact. Gait is normal. Skin: Skin is warm, dry and intact. No rash noted. Psychiatric: Mood and affect are normal. Speech and behavior are normal.  ____________________________________________   LABS (all labs ordered are listed, but only abnormal results are displayed)  Labs Reviewed  LIPASE, BLOOD - Abnormal; Notable for the following components:      Result Value   Lipase 58 (*)    All other components within normal limits  COMPREHENSIVE METABOLIC PANEL - Abnormal; Notable for the following components:   Glucose, Bld 114 (*)    All other components within normal limits  URINALYSIS, COMPLETE (UACMP) WITH MICROSCOPIC - Abnormal; Notable for the following components:   Color, Urine YELLOW (*)    APPearance CLEAR (*)    Squamous Epithelial / LPF  0-5 (*)    All other components within normal limits  AMMONIA - Abnormal; Notable for the following components:   Ammonia 38 (*)    All other components within normal limits  URINE DRUG SCREEN, QUALITATIVE ([facility] ONLY) - Abnormal; Notable for the following components:   Cannabinoid 50 Ng, Ur [place] POSITIVE (*)    All other components within normal limits  CBC  ETHANOL   ____________________________________________  EKG  ED ECG REPORT I, Nita Sicklearolina Bell Carbo, the attending physician, personally viewed and interpreted this ECG.  Normal sinus rhythm, rate of 52, normal intervals, normal axis, minimal STE in anterior leads with no reciprocal changes.  Unchanged from prior from October 2016 ____________________________________________  RADIOLOGY  Head CT: Negative noncontrast head CT. ____________________________________________   PROCEDURES  Procedure(s) performed: None Procedures Critical Care performed:  None ____________________________________________   INITIAL IMPRESSION / ASSESSMENT  AND PLAN / ED COURSE   41 y.o. male with a history of stroke 2 years ago who presents for evaluation of short lived episodes of confusion, disorientation, and vertigo for the last week. Patient is neurologically intact, NIHSS of 0, GCS of 15, alert and oriented 3. Head CT is negative for stroke. Had a long discussion with the patient that this could potentially be small TIAs that he is having and that it is very important that he takes his aspirin and Plavix especially since he had a stroke at the age of 22 to prevent a bigger stroke. Will check drug screen, alcohol level, ammonia. Basic labs WNL.      _________________________ 11:32 PM on 08/23/2017 -----------------------------------------  Unclear etiology of patient's symptoms at this time. It could definitely be due to TIAs. I have provided patient with prescriptions for Plavix and aspirin and have encouraged him to restart taking those  however at this time with normal vitals, normal neuro exam, normal CT head I believe patient is safe for discharge and follow-up as an outpatient. His labs show borderline ammonia 38 and borderline lipase at 58 therefore patient was sent for right upper quadrant ultrasound which is normal with no evidence of cirrhosis or stones. I have recommended the patient increases his by mouth hydration over the weekend and sees his primary care doctor in 2-3 days for repeat blood work as an outpatient. His drug screens positive for marijuana only. Discussed return precautions with patient. I believe patient is safe for discharge at this time and continue management as outpatient.   As part of my medical decision making, I reviewed the following data within the electronic MEDICAL RECORD NUMBER Nursing notes reviewed and incorporated, Labs reviewed , EKG interpreted , Old EKG reviewed, Radiograph reviewed , Notes from prior ED visits and [place] Controlled Substance Database    Pertinent labs & imaging results that were available during my care of the patient were reviewed by me and considered in my medical decision making (see chart for details).    ____________________________________________   FINAL CLINICAL IMPRESSION(S) / ED DIAGNOSES  Final diagnoses:  RUQ abdominal pain  Increased ammonia level  Confusion      NEW MEDICATIONS STARTED DURING THIS VISIT:  ED Discharge Orders        Ordered    aspirin EC 81 MG tablet  Daily     08/23/17 2329    clopidogrel (PLAVIX) 75 MG tablet  Daily     08/23/17 2329       Note:  This document was prepared using Dragon voice recognition software and may include unintentional dictation errors.    Nita Sickle, MD 08/23/17 [phone]

## 2017-11-04 ENCOUNTER — Other Ambulatory Visit: Payer: Self-pay

## 2017-11-04 ENCOUNTER — Encounter: Payer: Self-pay | Admitting: Emergency Medicine

## 2017-11-04 ENCOUNTER — Emergency Department
Admission: EM | Admit: 2017-11-04 | Discharge: 2017-11-04 | Disposition: A | Discharge status: Home / Self Care | Payer: BLUE CROSS/BLUE SHIELD | Attending: Student in an Organized Health Care Education/Training Program | Admitting: Student in an Organized Health Care Education/Training Program

## 2017-11-04 DIAGNOSIS — Z79899 Other long term (current) drug therapy: Secondary | ICD-10-CM | POA: Insufficient documentation

## 2017-11-04 DIAGNOSIS — Z7982 Long term (current) use of aspirin: Secondary | ICD-10-CM | POA: Diagnosis not present

## 2017-11-04 DIAGNOSIS — K1379 Other lesions of oral mucosa: Secondary | ICD-10-CM

## 2017-11-04 DIAGNOSIS — Z87891 Personal history of nicotine dependence: Secondary | ICD-10-CM | POA: Diagnosis not present

## 2017-11-04 DIAGNOSIS — Z7902 Long term (current) use of antithrombotics/antiplatelets: Secondary | ICD-10-CM | POA: Diagnosis not present

## 2017-11-04 DIAGNOSIS — K0889 Other specified disorders of teeth and supporting structures: Secondary | ICD-10-CM | POA: Insufficient documentation

## 2017-11-04 DIAGNOSIS — Z8673 Personal history of transient ischemic attack (TIA), and cerebral infarction without residual deficits: Secondary | ICD-10-CM | POA: Insufficient documentation

## 2017-11-04 NOTE — ED Notes (Signed)
See triage note  Presents with 1 day hx of dental pain pain is on lower gumline

## 2017-11-04 NOTE — ED Triage Notes (Signed)
Patient to ER for c/o lower middle dental pain since today.

## 2017-11-04 NOTE — Discharge Instructions (Signed)
You have a broken vein in the gumline of your mouth.  If this area becomes worse please return the emergency department.  If this area has not resolved in 2 weeks please call a dentist.  Swisher mouth with either Listerine or warm salt water.

## 2017-11-04 NOTE — ED Provider Notes (Signed)
[facility] Emergency Department Provider Note  ____________________________________________   First MD Initiated Contact with Patient 11/04/17 1718     (approximate)  I have reviewed the triage vital signs and the nursing notes.   HISTORY  Chief Complaint Dental Pain    HPI Steven Mclean is a 42 y.o. male presents emergency department because he is worried about an area he saw on his lower gumline.  He noticed it today.  Came right over.  He denies any injury.  He denies fever or chills  Past Medical History:  Diagnosis Date  . Stroke [facility](HCC) 05/2015    Patient Active Problem List   Diagnosis Date Noted  . CVA (cerebral infarction) 06/09/2015    Past Surgical History:  Procedure Laterality Date  . DENTAL SURGERY      Prior to Admission medications   Medication Sig Start Date End Date Taking? Authorizing Provider  aspirin EC 81 MG tablet Take 1 tablet (81 mg total) by mouth daily. 08/23/17   Nita SickleVeronese, [place], MD  atorvastatin (LIPITOR) 80 MG tablet Take 1 tablet (80 mg total) by mouth daily at 6 PM. 06/13/15   Gouru, Aruna, MD  clopidogrel (PLAVIX) 75 MG tablet Take 1 tablet (75 mg total) by mouth daily. 08/23/17   Nita SickleVeronese, [place], MD    Allergies Patient has no known allergies.  Family History  Problem Relation Age of Onset  . Deep vein thrombosis Mother   . Stroke Other     Social History Social History   Tobacco Use  . Smoking status: Former Smoker    Packs/day: 1.00    Types: Cigarettes    Last attempt to quit: 06/27/2015    Years since quitting: 2.3  . Smokeless tobacco: Never Used  Substance Use Topics  . Alcohol use: No  . Drug use: No    Review of Systems  Constitutional: No fever/chills Eyes: No visual changes. ENT: No sore throat.  Positive for a mouth lesion Respiratory: Denies cough Genitourinary: Negative for dysuria. Musculoskeletal: Negative for back pain. Skin: Negative for  rash.    ____________________________________________   PHYSICAL EXAM:  VITAL SIGNS: ED Triage Vitals  Enc Vitals Group     BP 11/04/17 1646 (!) 141/95     Pulse Rate 11/04/17 1646 75     Resp 11/04/17 1646 20     Temp 11/04/17 1646 98.2 F (36.8 C)     Temp Source 11/04/17 1646 Oral     SpO2 11/04/17 1646 97 %     Weight 11/04/17 1647 195 lb (88.5 kg)     Height 11/04/17 1647 5\' 8"  (1.727 m)     Head Circumference --      Peak Flow --      Pain Score 11/04/17 1647 6     Pain Loc --      Pain Edu? --      Excl. in GC? --     Constitutional: Alert and oriented. Well appearing and in no acute distress. Eyes: Conjunctivae are normal.  Head: Atraumatic. Nose: No congestion/rhinnorhea. Mouth/Throat: Mucous membranes are moist.  The lower gumline has a bruised bulging vein.  There is no ulceration or other lesion noted at this time. Neck: Supple, no lymphadenopathy is noted Cardiovascular: Normal rate, regular rhythm. Respiratory: Normal respiratory effort.  No retractions GU: deferred Musculoskeletal: FROM all extremities, warm and well perfused Neurologic:  Normal speech and language.  Skin:  Skin is warm, dry and intact. No rash noted. Psychiatric: Mood  and affect are normal. Speech and behavior are normal.  ____________________________________________   LABS (all labs ordered are listed, but only abnormal results are displayed)  Labs Reviewed - No data to display ____________________________________________   ____________________________________________  RADIOLOGY    ____________________________________________   PROCEDURES  Procedure(s) performed: No  Procedures    ____________________________________________   INITIAL IMPRESSION / ASSESSMENT AND PLAN / ED COURSE  Pertinent labs & imaging results that were available during my care of the patient were reviewed by me and considered in my medical decision making (see chart for  details).  Patient is a 42 year old male that is concerned about a lesion he noticed today at lunch on his lower gumline.  On physical exam there appears to be a bulging dark vein on the lower gumline at the front teeth  This was discussed with the patient.  Explained to him that this may be harmless.  He is to swish with warm salt water or Listerine.  If the area is worsening he should return to emergency department.  If it is just not better and 2 weeks he should see a dentist.  He states he understands will comply with our instructions.  He was discharged in stable condition     As part of my medical decision making, I reviewed the following data within the electronic MEDICAL RECORD NUMBER Nursing notes reviewed and incorporated, Notes from prior ED visits and [place] Controlled Substance Database  ____________________________________________   FINAL CLINICAL IMPRESSION(S) / ED DIAGNOSES  Final diagnoses:  Pain in mouth      NEW MEDICATIONS STARTED DURING THIS VISIT:  Discharge Medication List as of 11/04/2017  5:30 PM       Note:  This document was prepared using Dragon voice recognition software and may include unintentional dictation errors.    Faythe Ghee, PA-C 11/04/17 1736    Willy Eddy, MD 11/04/17 [phone]

## 2017-11-24 ENCOUNTER — Emergency Department: Payer: BLUE CROSS/BLUE SHIELD

## 2017-11-24 ENCOUNTER — Encounter: Payer: Self-pay | Admitting: Emergency Medicine

## 2017-11-24 ENCOUNTER — Other Ambulatory Visit: Payer: Self-pay

## 2017-11-24 ENCOUNTER — Emergency Department
Admission: EM | Admit: 2017-11-24 | Discharge: 2017-11-24 | Disposition: A | Discharge status: Home / Self Care | Payer: BLUE CROSS/BLUE SHIELD | Attending: Emergency Medicine | Admitting: Emergency Medicine

## 2017-11-24 DIAGNOSIS — Z7982 Long term (current) use of aspirin: Secondary | ICD-10-CM | POA: Diagnosis not present

## 2017-11-24 DIAGNOSIS — Z79899 Other long term (current) drug therapy: Secondary | ICD-10-CM | POA: Diagnosis not present

## 2017-11-24 DIAGNOSIS — G8929 Other chronic pain: Secondary | ICD-10-CM | POA: Diagnosis not present

## 2017-11-24 DIAGNOSIS — Z87891 Personal history of nicotine dependence: Secondary | ICD-10-CM | POA: Insufficient documentation

## 2017-11-24 DIAGNOSIS — Z7902 Long term (current) use of antithrombotics/antiplatelets: Secondary | ICD-10-CM | POA: Diagnosis not present

## 2017-11-24 DIAGNOSIS — R109 Unspecified abdominal pain: Secondary | ICD-10-CM | POA: Diagnosis not present

## 2017-11-24 DIAGNOSIS — M545 Low back pain: Secondary | ICD-10-CM | POA: Diagnosis not present

## 2017-11-24 HISTORY — DX: Calculus of kidney: N20.0

## 2017-11-24 LAB — CBC
HEMATOCRIT: 46.8 % (ref 40.0–52.0)
Hemoglobin: 15.5 g/dL (ref 13.0–18.0)
MCH: 29.9 pg (ref 26.0–34.0)
MCHC: 33.2 g/dL (ref 32.0–36.0)
MCV: 89.9 fL (ref 80.0–100.0)
PLATELETS: 338 10*3/uL (ref 150–440)
RBC: 5.2 MIL/uL (ref 4.40–5.90)
RDW: 14.4 % (ref 11.5–14.5)
WBC: 7.7 10*3/uL (ref 3.8–10.6)

## 2017-11-24 LAB — BASIC METABOLIC PANEL
Anion gap: 6 (ref 5–15)
BUN: 14 mg/dL (ref 6–20)
CHLORIDE: 110 mmol/L (ref 101–111)
CO2: 26 mmol/L (ref 22–32)
CREATININE: 0.97 mg/dL (ref 0.61–1.24)
Calcium: 9.3 mg/dL (ref 8.9–10.3)
GFR calc Af Amer: >60 mL/min (ref >60)
GLUCOSE: 119 mg/dL — AB (ref 65–99)
POTASSIUM: 4.3 mmol/L (ref 3.5–5.1)
Sodium: 142 mmol/L (ref 135–145)

## 2017-11-24 LAB — URINALYSIS, COMPLETE (UACMP) WITH MICROSCOPIC
BILIRUBIN URINE: NEGATIVE
Bacteria, UA: NONE SEEN
GLUCOSE, UA: NEGATIVE mg/dL
HGB URINE DIPSTICK: NEGATIVE
Ketones, ur: NEGATIVE mg/dL
LEUKOCYTES UA: NEGATIVE
NITRITE: NEGATIVE
PROTEIN: NEGATIVE mg/dL
Specific Gravity, Urine: 1.017 (ref 1.005–1.030)
Squamous Epithelial / LPF: NONE SEEN
pH: 6 (ref 5.0–8.0)

## 2017-11-24 MED ORDER — TRAMADOL HCL 50 MG PO TABS: 50.0000 mg | ORAL_TABLET | Freq: Four times a day (QID) | ORAL | 0 refills | Status: DC | PRN

## 2017-11-24 NOTE — Discharge Instructions (Signed)
Follow-up with your primary care doctor within the next few weeks to determine if you need any further testing as an outpatient or a referral to any specialist for this chronic back pain.  Continue to take over-the-counter Tylenol for the pain; you may use the prescribed tramadol as needed for more severe breakthrough pain, but this is not a long-term medication.  Return to the ER for new, worsening, or persistent severe pain, weakness or numbness, difficulty walking, fever, or any other new or worsening symptoms that concern you.

## 2017-11-24 NOTE — ED Provider Notes (Signed)
[facility] Emergency Department Provider Note ____________________________________________   First MD Initiated Contact with Patient 11/24/17 1408     (approximate)  I have reviewed the triage vital signs and the nursing notes.   HISTORY  Chief Complaint Flank Pain    HPI Steven Mclean is a 42 y.o. male with past medical history as noted below who presents with right lower back pain for approximately the last 9 months, persistent course, worse with walking and with certain movements, and not radiating anywhere else.  Patient denies numbness or weakness.  Denies any trauma.  The patient states that last July when he first had this pain he had an x-ray done, which showed a possible right kidney stone.  He states that more recently the pain somewhat increased and he went to his primary care doctor.  He was diagnosed with a UTI and treated with antibiotics, but he states that the pain persists.   Past Medical History:  Diagnosis Date  . Kidney stone   . Stroke [facility]) 05/2015    Patient Active Problem List   Diagnosis Date Noted  . CVA (cerebral infarction) 06/09/2015    Past Surgical History:  Procedure Laterality Date  . DENTAL SURGERY      Prior to Admission medications   Medication Sig Start Date End Date Taking? Authorizing Provider  aspirin EC 81 MG tablet Take 1 tablet (81 mg total) by mouth daily. 08/23/17   Nita Sickle, MD  atorvastatin (LIPITOR) 80 MG tablet Take 1 tablet (80 mg total) by mouth daily at 6 PM. 06/13/15   Gouru, Aruna, MD  clopidogrel (PLAVIX) 75 MG tablet Take 1 tablet (75 mg total) by mouth daily. 08/23/17   Nita Sickle, MD  traMADol (ULTRAM) 50 MG tablet Take 1 tablet (50 mg total) by mouth every 6 (six) hours as needed for severe pain. 11/24/17   Dionne Bucy, MD    Allergies Patient has no known allergies.  Family History  Problem Relation Age of Onset  . Deep vein thrombosis Mother   . Stroke  Other     Social History Social History   Tobacco Use  . Smoking status: Former Smoker    Packs/day: 1.00    Types: Cigarettes    Last attempt to quit: 06/27/2015    Years since quitting: 2.4  . Smokeless tobacco: Never Used  Substance Use Topics  . Alcohol use: No  . Drug use: No    Review of Systems  Constitutional: No fever. Eyes: No redness. ENT: No neck pain. Cardiovascular: Denies chest pain. Respiratory: Denies shortness of breath. Gastrointestinal: No abdominal pain.  Genitourinary: Negative for dysuria or hematuria.  Musculoskeletal: Positive for back pain. Skin: Negative for rash. Neurological: Negative for focal weakness or numbness.   ____________________________________________   PHYSICAL EXAM:  VITAL SIGNS: ED Triage Vitals [11/24/17 1053]  Enc Vitals Group     BP (!) 118/93     Pulse Rate (!) 55     Resp 16     Temp 98.5 F (36.9 C)     Temp Source Oral     SpO2 99 %     Weight 190 lb (86.2 kg)     Height 5\' 8"  (1.727 m)     Head Circumference      Peak Flow      Pain Score 8     Pain Loc      Pain Edu?      Excl. in GC?  Constitutional: Alert and oriented. Well appearing and in no acute distress. Eyes: Conjunctivae are normal.  Head: Atraumatic. Nose: No congestion/rhinnorhea. Mouth/Throat: Mucous membranes are moist.   Neck: Normal range of motion.  Cardiovascular: Good peripheral circulation. Respiratory: Normal respiratory effort.  Gastrointestinal: Soft and nontender. No distention.  Genitourinary: No CVA tenderness. Musculoskeletal: No lower extremity edema.  Extremities warm and well perfused.  Mild right lumbar paraspinal muscle tenderness.  No increased pain on straight leg raise. Neurologic:  Normal speech and language. No gross focal neurologic deficits are appreciated.  5/5 motor strength and intact sensation to bilateral lower extremities.  Normal gait. Skin:  Skin is warm and dry. No rash noted. Psychiatric: Mood and  affect are normal. Speech and behavior are normal.  ____________________________________________   LABS (all labs ordered are listed, but only abnormal results are displayed)  Labs Reviewed  URINALYSIS, COMPLETE (UACMP) WITH MICROSCOPIC - Abnormal; Notable for the following components:      Result Value   Color, Urine YELLOW (*)    APPearance CLEAR (*)    All other components within normal limits  BASIC METABOLIC PANEL - Abnormal; Notable for the following components:   Glucose, Bld 119 (*)    All other components within normal limits  CBC   ____________________________________________  EKG   ____________________________________________  RADIOLOGY  CT abdomen: No ureteral stone or other acute abnormality  ____________________________________________   PROCEDURES  Procedure(s) performed: No  Procedures  Critical Care performed: No ____________________________________________   INITIAL IMPRESSION / ASSESSMENT AND PLAN / ED COURSE  Pertinent labs & imaging results that were available during my care of the patient were reviewed by me and considered in my medical decision making (see chart for details).  42 year old male with past medical history as noted above presents with chronic right lower back pain for the last 9 months which he was told might be related to a kidney stone seen on x-ray at that time.  Patient more recently had an increase in the pain and was treated for UTI, but states that the pain persists.  He denies any abdominal component of the pain, radiation down the leg, numbness or weakness.  No trauma.  I reviewed the past medical records in Epic and confirmed that the patient had an x-ray of his lumbar spine in July 2018 showing a possible 2 mm right ureteral stone.  More recently in December he had a right upper quadrant ultrasound which was negative.  He has not had any further imaging to evaluate for stone.  Overall based on the location of the pain  is much more consistent with muscle strain/spasm versus sciatica, however given that the patient has a known stone on that side it is possibly related.  Will obtain a CT stone study to rule out obstructing ureteral stone, obtain basic labs and UA, and reassess.    ----------------------------------------- 3:43 PM on 11/24/2017 -----------------------------------------  CT shows no ureteral stone, and patient's UA and labs are within normal limits.  He continues to be comfortable appearing.  Pain is consistent with lumbar radiculopathy versus sciatica or muscle strain/spasm.  As patient is on aspirin he cannot take NSAIDs and has already been taking Tylenol, so I will prescribe tramadol for short-term relief.  I emphasized the patient that this is not a long-term solution and that he must follow-up with his primary care and may need referral to a specialist.  He agreed with the plan.    Return precautions given, and he expresses understanding.  ____________________________________________   FINAL CLINICAL IMPRESSION(S) / ED DIAGNOSES  Final diagnoses:  Chronic right-sided low back pain, with sciatica presence unspecified      NEW MEDICATIONS STARTED DURING THIS VISIT:  New Prescriptions   TRAMADOL (ULTRAM) 50 MG TABLET    Take 1 tablet (50 mg total) by mouth every 6 (six) hours as needed for severe pain.     Note:  This document was prepared using Dragon voice recognition software and may include unintentional dictation errors.    Dionne Bucy, MD 11/24/17 [phone]

## 2017-11-24 NOTE — ED Triage Notes (Signed)
Pt here for right flank pain.  Has had since last year when dx with kidney stone. Reports pain never went away.  Treated 2 weeks ago for UTI.  Pt thinks still has the kidney stone. Never FU with urology.  No fevers. No abdominal pain.

## 2018-02-13 ENCOUNTER — Other Ambulatory Visit: Payer: Self-pay

## 2018-02-13 ENCOUNTER — Encounter: Payer: Self-pay | Admitting: Emergency Medicine

## 2018-02-13 ENCOUNTER — Emergency Department
Admission: EM | Admit: 2018-02-13 | Discharge: 2018-02-13 | Disposition: A | Discharge status: Home / Self Care | Payer: BLUE CROSS/BLUE SHIELD | Attending: Emergency Medicine | Admitting: Emergency Medicine

## 2018-02-13 DIAGNOSIS — Z87891 Personal history of nicotine dependence: Secondary | ICD-10-CM | POA: Insufficient documentation

## 2018-02-13 DIAGNOSIS — Z7982 Long term (current) use of aspirin: Secondary | ICD-10-CM | POA: Insufficient documentation

## 2018-02-13 DIAGNOSIS — L0291 Cutaneous abscess, unspecified: Secondary | ICD-10-CM

## 2018-02-13 DIAGNOSIS — Z79899 Other long term (current) drug therapy: Secondary | ICD-10-CM | POA: Insufficient documentation

## 2018-02-13 DIAGNOSIS — L02415 Cutaneous abscess of right lower limb: Secondary | ICD-10-CM | POA: Insufficient documentation

## 2018-02-13 DIAGNOSIS — Z8673 Personal history of transient ischemic attack (TIA), and cerebral infarction without residual deficits: Secondary | ICD-10-CM | POA: Insufficient documentation

## 2018-02-13 DIAGNOSIS — Z7902 Long term (current) use of antithrombotics/antiplatelets: Secondary | ICD-10-CM | POA: Insufficient documentation

## 2018-02-13 MED ORDER — CLINDAMYCIN HCL 150 MG PO CAPS
300.0000 mg | ORAL_CAPSULE | Freq: Once | ORAL | Status: AC
  Administered 2018-02-13: 300 mg via ORAL
  Filled 2018-02-13: qty 2

## 2018-02-13 MED ORDER — CLINDAMYCIN HCL 300 MG PO CAPS: 300.0000 mg | ORAL_CAPSULE | Freq: Three times a day (TID) | ORAL | 0 refills | Status: DC

## 2018-02-13 NOTE — ED Notes (Signed)
Pt to the er for a place on his lower right leg and upper right leg that may be abceses. Areas are small, non draining but do itch. Pt using hibiclens at home.

## 2018-02-13 NOTE — ED Provider Notes (Signed)
[facility] Emergency Department Provider Note   ____________________________________________   First MD Initiated Contact with Patient 02/13/18 0601     (approximate)  I have reviewed the triage vital signs and the nursing notes.   HISTORY  Chief Complaint Abscess    HPI Steven Mclean is a 42 y.o. male who presents to the ED from home with a chief complaint of abscess.  Patient has a history of MRSA; reports several abscesses in different spots for the past month.  Most recently has noticed some reddened pimple-like areas along his right lower leg.  Currently has a Band-Aid over his right anterior shin.  Denies associated fever, chills, chest pain, shortness of breath, abdominal pain, nausea or vomiting.  Denies recent travel or trauma.   Past Medical History:  Diagnosis Date  . Kidney stone   . Stroke [facility]) 05/2015    Patient Active Problem List   Diagnosis Date Noted  . CVA (cerebral infarction) 06/09/2015    Past Surgical History:  Procedure Laterality Date  . DENTAL SURGERY      Prior to Admission medications   Medication Sig Start Date End Date Taking? Authorizing Provider  aspirin EC 81 MG tablet Take 1 tablet (81 mg total) by mouth daily. 08/23/17   Nita Sickle, MD  atorvastatin (LIPITOR) 80 MG tablet Take 1 tablet (80 mg total) by mouth daily at 6 PM. 06/13/15   Gouru, Deanna Artis, MD  clindamycin (CLEOCIN) 300 MG capsule Take 1 capsule (300 mg total) by mouth 3 (three) times daily. 02/13/18   Irean Hong, MD  clopidogrel (PLAVIX) 75 MG tablet Take 1 tablet (75 mg total) by mouth daily. 08/23/17   Nita Sickle, MD  traMADol (ULTRAM) 50 MG tablet Take 1 tablet (50 mg total) by mouth every 6 (six) hours as needed for severe pain. 11/24/17   Dionne Bucy, MD    Allergies Patient has no known allergies.  Family History  Problem Relation Age of Onset  . Deep vein thrombosis Mother   . Stroke Other     Social  History Social History   Tobacco Use  . Smoking status: Former Smoker    Packs/day: 1.00    Types: Cigarettes    Last attempt to quit: 06/27/2015    Years since quitting: 2.6  . Smokeless tobacco: Never Used  Substance Use Topics  . Alcohol use: No  . Drug use: No    Review of Systems  Constitutional: No fever/chills Eyes: No visual changes. ENT: No sore throat. Cardiovascular: Denies chest pain. Respiratory: Denies shortness of breath. Gastrointestinal: No abdominal pain.  No nausea, no vomiting.  No diarrhea.  No constipation. Genitourinary: Negative for dysuria. Musculoskeletal: Negative for back pain. Skin: Positive for abscesses. Neurological: Negative for headaches, focal weakness or numbness.   ____________________________________________   PHYSICAL EXAM:  VITAL SIGNS: ED Triage Vitals  Enc Vitals Group     BP 02/13/18 0547 139/68     Pulse Rate 02/13/18 0547 73     Resp 02/13/18 0547 16     Temp 02/13/18 0547 98 F (36.7 C)     Temp Source 02/13/18 0547 Oral     SpO2 02/13/18 0547 97 %     Weight 02/13/18 0544 195 lb (88.5 kg)     Height 02/13/18 0544 5\' 8"  (1.727 m)     Head Circumference --      Peak Flow --      Pain Score 02/13/18 0544 5  Pain Loc --      Pain Edu? --      Excl. in GC? --     Constitutional: Asleep, awakened for exam.  Alert and oriented. Well appearing and in no acute distress. Eyes: Conjunctivae are normal. PERRL. EOMI. Head: Atraumatic. Nose: No congestion/rhinnorhea. Mouth/Throat: Mucous membranes are moist.  Oropharynx non-erythematous. Neck: No stridor.   Cardiovascular: Normal rate, regular rhythm. Grossly normal heart sounds.  Good peripheral circulation. Respiratory: Normal respiratory effort.  No retractions. Lungs CTAB. Gastrointestinal: Soft and nontender. No distention. No abdominal bruits. No CVA tenderness. Musculoskeletal: No lower extremity tenderness nor edema.  No joint effusions. Neurologic:  Normal  speech and language. No gross focal neurologic deficits are appreciated. No gait instability. Skin:  Skin is warm, dry and intact.  Tiny reddened area beneath Band-Aid on patient's anterior right shin.  Scattered reddened areas on patient's thigh which he states were worse several days ago and purulent.   Psychiatric: Mood and affect are normal. Speech and behavior are normal.  ____________________________________________   LABS (all labs ordered are listed, but only abnormal results are displayed)  Labs Reviewed - No data to display ____________________________________________  EKG  None ____________________________________________  RADIOLOGY  ED MD interpretation: None  Official radiology report(s): No results found.  ____________________________________________   PROCEDURES  Procedure(s) performed: None  Procedures  Critical Care performed: No  ____________________________________________   INITIAL IMPRESSION / ASSESSMENT AND PLAN / ED COURSE  As part of my medical decision making, I reviewed the following data within the electronic MEDICAL RECORD NUMBER Nursing notes reviewed and incorporated, Old chart reviewed and Notes from prior ED visits   42 year old male who presents with what sounds like multiple small abscesses over the past month.  I took off the Band-Aid and encourage patient to keep those areas open.  None require I&D at this time.  Will place him on clindamycin as he has had previous MRSA infections.  Strict return precautions given.  Patient verbalizes understanding and agrees with plan of care.      ____________________________________________   FINAL CLINICAL IMPRESSION(S) / ED DIAGNOSES  Final diagnoses:  Abscess     ED Discharge Orders        Ordered    clindamycin (CLEOCIN) 300 MG capsule  3 times daily     02/13/18 0608       Note:  This document was prepared using Dragon voice recognition software and may include unintentional  dictation errors.    Irean HongSung, Jade J, MD 02/13/18 [phone]

## 2018-02-13 NOTE — Discharge Instructions (Addendum)
1.  Take antibiotic as prescribed (Clindamycin 300 mg 3 times daily for 10 days). 2.  Return to the ER for worsening symptoms, persistent vomiting, fever or other concerns.

## 2018-02-13 NOTE — ED Triage Notes (Signed)
Patient ambulatory to triage with steady gait, without difficulty or distress noted; pt reports hx MRSA; has had several abscess in different spots x month

## 2018-03-05 ENCOUNTER — Encounter (HOSPITAL_COMMUNITY): Payer: Self-pay

## 2018-03-05 ENCOUNTER — Other Ambulatory Visit: Payer: Self-pay

## 2018-03-05 ENCOUNTER — Emergency Department (HOSPITAL_COMMUNITY)
Admission: EM | Admit: 2018-03-05 | Discharge: 2018-03-05 | Disposition: A | Discharge status: Home / Self Care | Payer: Self-pay | Attending: Emergency Medicine | Admitting: Emergency Medicine

## 2018-03-05 DIAGNOSIS — Z7902 Long term (current) use of antithrombotics/antiplatelets: Secondary | ICD-10-CM | POA: Insufficient documentation

## 2018-03-05 DIAGNOSIS — Z7982 Long term (current) use of aspirin: Secondary | ICD-10-CM | POA: Insufficient documentation

## 2018-03-05 DIAGNOSIS — Z87891 Personal history of nicotine dependence: Secondary | ICD-10-CM | POA: Insufficient documentation

## 2018-03-05 DIAGNOSIS — Z8673 Personal history of transient ischemic attack (TIA), and cerebral infarction without residual deficits: Secondary | ICD-10-CM | POA: Insufficient documentation

## 2018-03-05 DIAGNOSIS — L02415 Cutaneous abscess of right lower limb: Secondary | ICD-10-CM | POA: Insufficient documentation

## 2018-03-05 DIAGNOSIS — Z79899 Other long term (current) drug therapy: Secondary | ICD-10-CM | POA: Insufficient documentation

## 2018-03-05 HISTORY — DX: Unspecified staphylococcus as the cause of diseases classified elsewhere: B95.8

## 2018-03-05 HISTORY — DX: Methicillin resistant Staphylococcus aureus infection, unspecified site: A49.02

## 2018-03-05 LAB — CBC WITH DIFFERENTIAL/PLATELET
ABS IMMATURE GRANULOCYTES: 0 10*3/uL (ref 0.0–0.1)
Basophils Absolute: 0 10*3/uL (ref 0.0–0.1)
Basophils Relative: 0 %
Eosinophils Absolute: 0.2 10*3/uL (ref 0.0–0.7)
Eosinophils Relative: 2 %
HEMATOCRIT: 43 % (ref 39.0–52.0)
Hemoglobin: 13.7 g/dL (ref 13.0–17.0)
IMMATURE GRANULOCYTES: 0 %
LYMPHS ABS: 2.2 10*3/uL (ref 0.7–4.0)
Lymphocytes Relative: 27 %
MCH: 29.4 pg (ref 26.0–34.0)
MCHC: 31.9 g/dL (ref 30.0–36.0)
MCV: 92.3 fL (ref 78.0–100.0)
MONOS PCT: 10 %
Monocytes Absolute: 0.8 10*3/uL (ref 0.1–1.0)
Neutro Abs: 5.1 10*3/uL (ref 1.7–7.7)
Neutrophils Relative %: 61 %
PLATELETS: 404 10*3/uL — AB (ref 150–400)
RBC: 4.66 MIL/uL (ref 4.22–5.81)
RDW: 13.6 % (ref 11.5–15.5)
WBC: 8.3 10*3/uL (ref 4.0–10.5)

## 2018-03-05 LAB — COMPREHENSIVE METABOLIC PANEL
ALK PHOS: 106 U/L (ref 38–126)
ALT: 57 U/L — AB (ref 0–44)
AST: 31 U/L (ref 15–41)
Albumin: 3.8 g/dL (ref 3.5–5.0)
Anion gap: 9 (ref 5–15)
BILIRUBIN TOTAL: 0.5 mg/dL (ref 0.3–1.2)
BUN: 10 mg/dL (ref 6–20)
CALCIUM: 9.2 mg/dL (ref 8.9–10.3)
CHLORIDE: 109 mmol/L (ref 98–111)
CO2: 25 mmol/L (ref 22–32)
Creatinine, Ser: 1.1 mg/dL (ref 0.61–1.24)
GFR calc Af Amer: >60 mL/min (ref >60)
Glucose, Bld: 83 mg/dL (ref 70–99)
Potassium: 3.9 mmol/L (ref 3.5–5.1)
Sodium: 143 mmol/L (ref 135–145)
TOTAL PROTEIN: 6.7 g/dL (ref 6.5–8.1)

## 2018-03-05 LAB — I-STAT CG4 LACTIC ACID, ED: Lactic Acid, Venous: 1.18 mmol/L (ref 0.5–1.9)

## 2018-03-05 MED ORDER — DOXYCYCLINE HYCLATE 100 MG PO TABS
100.0000 mg | ORAL_TABLET | Freq: Once | ORAL | Status: AC
Start: 2018-03-05 — End: 2018-03-05
  Administered 2018-03-05: 100 mg via ORAL
  Filled 2018-03-05: qty 1

## 2018-03-05 MED ORDER — DOXYCYCLINE HYCLATE 100 MG PO CAPS: 100.0000 mg | ORAL_CAPSULE | Freq: Two times a day (BID) | ORAL | 0 refills | Status: DC

## 2018-03-05 NOTE — ED Triage Notes (Signed)
Pt endorses hx of MRSA and staph infection to right lower leg. Pt has been taking antibiotic for last 3 weeks, wounds to right lower leg not getting better. Redness noted around both sores. AFebrile, VSS.

## 2018-03-05 NOTE — ED Provider Notes (Signed)
[facility] EMERGENCY DEPARTMENT Provider Note   CSN: [id number] Arrival date & time: 03/05/18  1840     History   Chief Complaint Chief Complaint  Patient presents with  . Wound Infection    HPI Steven Mclean is a 42 y.o. male with a past medical history of prior CVA, MRSA, who presents to ED for evaluation of abscess to right calf that began yesterday.  Patient was seen and evaluated at [facility] ED 6/21 for similar abscesses.  States that he has had them for about 6 years and he usually "treats them myself."  He completed his 10-day course of clindamycin after that ED visit.  States that his symptoms resolved completely.  However, in the 2 weeks that he has been off of the medication, he has had "about one pimple a day pop up on my leg."  He denies any fever, drainage from the area, self attempt to incise/drain, changes in gait, injury or trauma to the area.  HPI  Past Medical History:  Diagnosis Date  . Kidney stone   . MRSA (methicillin resistant Staphylococcus aureus)   . Staph infection   . Stroke [facility]) 05/2015    Patient Active Problem List   Diagnosis Date Noted  . CVA (cerebral infarction) 06/09/2015    Past Surgical History:  Procedure Laterality Date  . DENTAL SURGERY          Home Medications    Prior to Admission medications   Medication Sig Start Date End Date Taking? Authorizing Provider  aspirin EC 81 MG tablet Take 1 tablet (81 mg total) by mouth daily. 08/23/17   Nita Sickle, MD  atorvastatin (LIPITOR) 80 MG tablet Take 1 tablet (80 mg total) by mouth daily at 6 PM. 06/13/15   Gouru, Deanna Artis, MD  clindamycin (CLEOCIN) 300 MG capsule Take 1 capsule (300 mg total) by mouth 3 (three) times daily. 02/13/18   Irean Hong, MD  clopidogrel (PLAVIX) 75 MG tablet Take 1 tablet (75 mg total) by mouth daily. 08/23/17   Nita Sickle, MD  doxycycline (VIBRAMYCIN) 100 MG capsule Take 1 capsule (100 mg total) by mouth 2 (two) times  daily. 03/05/18   Zinnia Tindall, PA-C  traMADol (ULTRAM) 50 MG tablet Take 1 tablet (50 mg total) by mouth every 6 (six) hours as needed for severe pain. 11/24/17   Dionne Bucy, MD    Family History Family History  Problem Relation Age of Onset  . Deep vein thrombosis Mother   . Stroke Other     Social History Social History   Tobacco Use  . Smoking status: Former Smoker    Packs/day: 1.00    Types: Cigarettes    Last attempt to quit: 06/27/2015    Years since quitting: 2.6  . Smokeless tobacco: Never Used  Substance Use Topics  . Alcohol use: No  . Drug use: No     Allergies   Patient has no known allergies.   Review of Systems Review of Systems  Constitutional: Negative for appetite change, chills and fever.  HENT: Negative for ear pain, rhinorrhea, sneezing and sore throat.   Eyes: Negative for photophobia and visual disturbance.  Respiratory: Negative for cough, chest tightness, shortness of breath and wheezing.   Cardiovascular: Negative for chest pain and palpitations.  Gastrointestinal: Negative for abdominal pain, blood in stool, constipation, diarrhea, nausea and vomiting.  Genitourinary: Negative for dysuria, hematuria and urgency.  Musculoskeletal: Negative for myalgias.  Skin: Positive for rash.  Neurological:  Negative for dizziness, weakness and light-headedness.     Physical Exam Updated Vital Signs BP (!) 146/103   Pulse 71   Temp 98.2 F (36.8 C) (Oral)   Resp 18   Ht 5\' 8"  (1.727 m)   Wt 88.5 kg (195 lb)   SpO2 100%   BMI 29.65 kg/m   Physical Exam  Constitutional: He appears well-developed and well-nourished. No distress.  HENT:  Head: Normocephalic and atraumatic.  Nose: Nose normal.  Eyes: Conjunctivae and EOM are normal. Left eye exhibits no discharge. No scleral icterus.  Neck: Normal range of motion. Neck supple.  Cardiovascular: Normal rate, regular rhythm, normal heart sounds and intact distal pulses. Exam reveals no gallop  and no friction rub.  No murmur heard. Pulmonary/Chest: Effort normal and breath sounds normal. No respiratory distress.  Abdominal: Soft. Bowel sounds are normal. He exhibits no distension. There is no tenderness. There is no guarding.  Musculoskeletal: Normal range of motion. He exhibits no edema.  Neurological: He is alert. He exhibits normal muscle tone. Coordination normal.  Skin: Skin is warm and dry. No rash noted.  Multiple erythematous spots noted to R lower leg. One pimple-like area noted to R calf with mild induration and no fluctuance noted. No streaking, changes to sensation or overlying warmth noted. NVI.  Psychiatric: He has a normal mood and affect.  Nursing note and vitals reviewed.    ED Treatments / Results  Labs (all labs ordered are listed, but only abnormal results are displayed) Labs Reviewed  COMPREHENSIVE METABOLIC PANEL - Abnormal; Notable for the following components:      Result Value   ALT 57 (*)    All other components within normal limits  CBC WITH DIFFERENTIAL/PLATELET - Abnormal; Notable for the following components:   Platelets 404 (*)    All other components within normal limits  I-STAT CG4 LACTIC ACID, ED    EKG None  Radiology No results found.  Procedures Procedures (including critical care time)  Medications Ordered in ED Medications  doxycycline (VIBRA-TABS) tablet 100 mg (has no administration in time range)     Initial Impression / Assessment and Plan / ED Course  I have reviewed the triage vital signs and the nursing notes.  Pertinent labs & imaging results that were available during my care of the patient were reviewed by me and considered in my medical decision making (see chart for details).     42 year old male with past medical history of prior CVA, MRSA presents for recurrent abscesses to right calf that began yesterday.  He was seen and evaluated at [facility] ED 6/21 for similar abscesses.  States he has had them for  about 6 years and usually treats them himself.  Completed his 10-day course of clindamycin after the ED visit.  Reports significant improvement in his symptoms then.  He has had 2 pimple-like areas pop up on his leg since then.  Denies any fever, drainage from the area, self and type to incise or drain, changes in gait, injury or trauma to the area.  On physical exam there are multiple pimple-like areas noted to the right lower leg.  No fluctuance noted.  None of these are amenable to incision and drainage at this time.  There is no overlying warmth, streaking or signs of systemic infection.  Patient is afebrile.  CBC, lactate and BMP unremarkable.  I told the patient the possibility of speaking to his provider if they feel like he needs to be on a  daily antibiotic for suppression of this since it has been an ongoing issue for him.  However, for this episode we will treat with doxycycline.  Advised to return to ED for any severe worsening symptoms.  Portions of this note were generated with Scientist, clinical (histocompatibility and immunogenetics)Dragon dictation software. Dictation errors may occur despite best attempts at proofreading.   Final Clinical Impressions(s) / ED Diagnoses   Final diagnoses:  Cutaneous abscess of right lower extremity    ED Discharge Orders        Ordered    doxycycline (VIBRAMYCIN) 100 MG capsule  2 times daily     03/05/18 2129       Dietrich PatesKhatri, Thersea [place]fred[place], PA-C 03/05/18 2132    Jacalyn LefevreHaviland, Julie, MD 03/05/18 2227

## 2018-03-05 NOTE — ED Provider Notes (Signed)
Patient placed in Quick Look pathway, seen and evaluated   Chief Complaint: Abscesses  HPI:   Patient was seen at [place] on 6/21 for abscesses, was treated with clinda.  He reports they got better but then today had a recurrence of a abscess on his right calf.  They are painful.  No fevers.   ROS: No numbness or tingling (one)  Physical Exam:   Gen: No distress  Neuro: Awake and Alert  Skin: Warm    Focused Exam: There are multiple red areas on the right calf.  With induration.     Initiation of care has begun. The patient has been counseled on the process, plan, and necessity for staying for the completion/evaluation, and the remainder of the medical screening examination    Norman ClayHammond, Jennah Satchell W, PA-C 03/05/18 1909    Charlynne PanderYao, David Hsienta, MD 03/05/18 2249

## 2018-03-05 NOTE — Discharge Instructions (Signed)
Return to ED for any worsening symptoms including changes in walking, numbness to your leg, red streaking of your legs, severe leg pain or fever.

## 2018-05-26 ENCOUNTER — Encounter: Payer: Self-pay | Admitting: Emergency Medicine

## 2018-05-26 ENCOUNTER — Emergency Department
Admission: EM | Admit: 2018-05-26 | Discharge: 2018-05-26 | Disposition: A | Discharge status: Home / Self Care | Payer: Self-pay | Attending: Emergency Medicine | Admitting: Emergency Medicine

## 2018-05-26 ENCOUNTER — Other Ambulatory Visit: Payer: Self-pay

## 2018-05-26 DIAGNOSIS — Z79899 Other long term (current) drug therapy: Secondary | ICD-10-CM | POA: Insufficient documentation

## 2018-05-26 DIAGNOSIS — N369 Urethral disorder, unspecified: Secondary | ICD-10-CM | POA: Insufficient documentation

## 2018-05-26 DIAGNOSIS — Z711 Person with feared health complaint in whom no diagnosis is made: Secondary | ICD-10-CM

## 2018-05-26 DIAGNOSIS — Z87891 Personal history of nicotine dependence: Secondary | ICD-10-CM | POA: Insufficient documentation

## 2018-05-26 DIAGNOSIS — Z7902 Long term (current) use of antithrombotics/antiplatelets: Secondary | ICD-10-CM | POA: Insufficient documentation

## 2018-05-26 DIAGNOSIS — Z202 Contact with and (suspected) exposure to infections with a predominantly sexual mode of transmission: Secondary | ICD-10-CM | POA: Insufficient documentation

## 2018-05-26 DIAGNOSIS — Z7982 Long term (current) use of aspirin: Secondary | ICD-10-CM | POA: Insufficient documentation

## 2018-05-26 LAB — URINALYSIS, COMPLETE (UACMP) WITH MICROSCOPIC
BACTERIA UA: NONE SEEN
Bilirubin Urine: NEGATIVE
Glucose, UA: NEGATIVE mg/dL
Hgb urine dipstick: NEGATIVE
Ketones, ur: NEGATIVE mg/dL
Leukocytes, UA: NEGATIVE
Nitrite: NEGATIVE
PH: 5 (ref 5.0–8.0)
Protein, ur: NEGATIVE mg/dL
SPECIFIC GRAVITY, URINE: 1.015 (ref 1.005–1.030)
SQUAMOUS EPITHELIAL / LPF: NONE SEEN (ref 0–5)

## 2018-05-26 LAB — CHLAMYDIA/NGC RT PCR (ARMC ONLY)
CHLAMYDIA TR: NOT DETECTED
N gonorrhoeae: NOT DETECTED

## 2018-05-26 NOTE — ED Notes (Signed)
See triage note  States he noticed an area to penis couple of days ago

## 2018-05-26 NOTE — ED Provider Notes (Signed)
[facility] Emergency Department Provider Note   ____________________________________________   First MD Initiated Contact with Patient 05/26/18 1120     (approximate)  I have reviewed the triage vital signs and the nursing notes.   HISTORY  Chief Complaint Rash    HPI Steven Mclean is a 42 y.o. male patient presents with concern for STD.  Patient had unprotected sex 63 sexual partner 2 weeks ago.  Patient state a couple days later a Plitt papular lesion.  Close to urethra open.  Patient states the lesion disappeared.  Patient states 2 days ago he had unprotected intercourse with a condom of another sexual partner and noticed another macular lesion near the urethral opening.  Patient denies urethral discharge or dysuria.   Past Medical History:  Diagnosis Date  . Kidney stone   . MRSA (methicillin resistant Staphylococcus aureus)   . Staph infection   . Stroke [facility]) 05/2015    Patient Active Problem List   Diagnosis Date Noted  . CVA (cerebral infarction) 06/09/2015    Past Surgical History:  Procedure Laterality Date  . DENTAL SURGERY      Prior to Admission medications   Medication Sig Start Date End Date Taking? Authorizing Provider  aspirin EC 81 MG tablet Take 1 tablet (81 mg total) by mouth daily. 08/23/17   Nita Sickle, MD  atorvastatin (LIPITOR) 80 MG tablet Take 1 tablet (80 mg total) by mouth daily at 6 PM. 06/13/15   Gouru, Deanna Artis, MD  clindamycin (CLEOCIN) 300 MG capsule Take 1 capsule (300 mg total) by mouth 3 (three) times daily. 02/13/18   Irean Hong, MD  clopidogrel (PLAVIX) 75 MG tablet Take 1 tablet (75 mg total) by mouth daily. 08/23/17   Nita Sickle, MD  doxycycline (VIBRAMYCIN) 100 MG capsule Take 1 capsule (100 mg total) by mouth 2 (two) times daily. 03/05/18   Khatri, Hina, PA-C  traMADol (ULTRAM) 50 MG tablet Take 1 tablet (50 mg total) by mouth every 6 (six) hours as needed for severe pain. 11/24/17   Dionne Bucy, MD    Allergies Patient has no known allergies.  Family History  Problem Relation Age of Onset  . Deep vein thrombosis Mother   . Stroke Other     Social History Social History   Tobacco Use  . Smoking status: Former Smoker    Packs/day: 1.00    Types: Cigarettes    Last attempt to quit: 06/27/2015    Years since quitting: 2.9  . Smokeless tobacco: Never Used  Substance Use Topics  . Alcohol use: No  . Drug use: No    Review of Systems Constitutional: No fever/chills Eyes: No visual changes. ENT: No sore throat. Cardiovascular: Denies chest pain. Respiratory: Denies shortness of breath. Gastrointestinal: No abdominal pain.  No nausea, no vomiting.  No diarrhea.  No constipation. Genitourinary: Negative for dysuria. Musculoskeletal: Negative for back pain. Skin: Macular lesion urethral orifices..  Neurological: Negative for headaches, focal weakness or numbness.   ____________________________________________   PHYSICAL EXAM:  VITAL SIGNS: ED Triage Vitals  Enc Vitals Group     BP 05/26/18 1101 (!) 143/93     Pulse Rate 05/26/18 1101 69     Resp 05/26/18 1101 16     Temp 05/26/18 1101 98.3 F (36.8 C)     Temp Source 05/26/18 1101 Oral     SpO2 05/26/18 1101 99 %     Weight 05/26/18 1102 195 lb (88.5 kg)  Height 05/26/18 1102 5\' 8"  (1.727 m)     Head Circumference --      Peak Flow --      Pain Score 05/26/18 1101 0     Pain Loc --      Pain Edu? --      Excl. in GC? --     Constitutional: Alert and oriented. Well appearing and in no acute distress. Cardiovascular: Normal rate, regular rhythm. Grossly normal heart sounds.  Good peripheral circulation. Respiratory: Normal respiratory effort.  No retractions. Lungs CTAB. Genitourinary: Macular lesion near the urethral orifice. Skin:  Skin is warm, dry and intact. No rash noted. Psychiatric: Mood and affect are normal. Speech and behavior are  normal.  ____________________________________________   LABS (all labs ordered are listed, but only abnormal results are displayed)  Labs Reviewed  URINALYSIS, COMPLETE (UACMP) WITH MICROSCOPIC - Abnormal; Notable for the following components:      Result Value   Color, Urine YELLOW (*)    APPearance CLEAR (*)    All other components within normal limits  CHLAMYDIA/NGC RT PCR ([facility] ONLY)  RPR  HIV ANTIBODY (ROUTINE TESTING W REFLEX)   ____________________________________________  EKG   ____________________________________________  RADIOLOGY  ED MD interpretation:    Official radiology report(s): No results found.  ____________________________________________   PROCEDURES  Procedure(s) performed: None  Procedures  Critical Care performed: No  ____________________________________________   INITIAL IMPRESSION / ASSESSMENT AND PLAN / ED COURSE  As part of my medical decision making, I reviewed the following data within the electronic MEDICAL RECORD NUMBER    Patient presents with concern for STD.  Labs did consisting of RPR, HIV, gonorrhea/chlamydia are pending.  Urinalysis was unremarkable.  Patient given discharge care instruction advised follow-up with the [facility] health department.     ____________________________________________   FINAL CLINICAL IMPRESSION(S) / ED DIAGNOSES  Final diagnoses:  Concern about STD in male without diagnosis     ED Discharge Orders    None       Note:  This document was prepared using Dragon voice recognition software and may include unintentional dictation errors.    Joni Reining, PA-C 05/26/18 1313    Sharman Cheek, MD 05/30/18 1346

## 2018-05-26 NOTE — ED Triage Notes (Signed)
Patient states he had sex using a condom 2 days ago and developed "spot" which he describes as kind of Kings near the head of his penis which resolved but is now back close to urethral opening.  Denies discharge or known exposure to STD's.  Wants to be checked for STD's.

## 2018-05-26 NOTE — Discharge Instructions (Addendum)
Your urinalysis showed no bacteria in the urine.  Your test for chlamydia/gonorrhea and HIV are pending.  You will be notified telephonically of results.

## 2018-05-27 LAB — RPR: RPR: NONREACTIVE

## 2018-05-27 LAB — HIV ANTIBODY (ROUTINE TESTING W REFLEX): HIV SCREEN 4TH GENERATION: NONREACTIVE

## 2018-06-26 ENCOUNTER — Encounter: Payer: Self-pay | Admitting: Emergency Medicine

## 2018-06-26 ENCOUNTER — Other Ambulatory Visit: Payer: Self-pay

## 2018-06-26 ENCOUNTER — Emergency Department
Admission: EM | Admit: 2018-06-26 | Discharge: 2018-06-26 | Disposition: A | Discharge status: Home / Self Care | Payer: Self-pay | Attending: Emergency Medicine | Admitting: Emergency Medicine

## 2018-06-26 ENCOUNTER — Emergency Department: Payer: Self-pay

## 2018-06-26 DIAGNOSIS — Z87891 Personal history of nicotine dependence: Secondary | ICD-10-CM | POA: Insufficient documentation

## 2018-06-26 DIAGNOSIS — Y999 Unspecified external cause status: Secondary | ICD-10-CM | POA: Insufficient documentation

## 2018-06-26 DIAGNOSIS — S39012A Strain of muscle, fascia and tendon of lower back, initial encounter: Secondary | ICD-10-CM | POA: Insufficient documentation

## 2018-06-26 DIAGNOSIS — Y939 Activity, unspecified: Secondary | ICD-10-CM | POA: Insufficient documentation

## 2018-06-26 DIAGNOSIS — Y92828 Other wilderness area as the place of occurrence of the external cause: Secondary | ICD-10-CM | POA: Insufficient documentation

## 2018-06-26 DIAGNOSIS — Z7902 Long term (current) use of antithrombotics/antiplatelets: Secondary | ICD-10-CM | POA: Insufficient documentation

## 2018-06-26 DIAGNOSIS — Z79899 Other long term (current) drug therapy: Secondary | ICD-10-CM | POA: Insufficient documentation

## 2018-06-26 DIAGNOSIS — Z7982 Long term (current) use of aspirin: Secondary | ICD-10-CM | POA: Insufficient documentation

## 2018-06-26 MED ORDER — PREDNISONE 20 MG PO TABS: 20.0000 mg | ORAL_TABLET | Freq: Two times a day (BID) | ORAL | 0 refills | Status: AC

## 2018-06-26 MED ORDER — CYCLOBENZAPRINE HCL 5 MG PO TABS: 5.0000 mg | ORAL_TABLET | Freq: Three times a day (TID) | ORAL | 0 refills | Status: AC | PRN

## 2018-06-26 MED ORDER — PREDNISONE 20 MG PO TABS
60.0000 mg | ORAL_TABLET | Freq: Once | ORAL | Status: AC
  Administered 2018-06-26: 60 mg via ORAL
  Filled 2018-06-26: qty 3

## 2018-06-26 NOTE — ED Notes (Signed)
See triage note  Presents with lower back pain  States he felt his back "crack/pop" while tubing 2 months ago    States pain has gotten worse over the past few days  States pain is mainly on the right side   Non radiating  Has not been seen by PCP  Denies any diff with bowel or bladder  Ambulates slowly d/t pain  Pt is tearful on arrival

## 2018-06-26 NOTE — Discharge Instructions (Signed)
Your exam and x-ray are consistent with muscle strain and spasm. There is no evidence of fracture, dislocation, or herniated disc. Take the prescription meds as directed. Apply ice or moist heat to reduce symptoms. Follow-up with your provider for ongoing symptoms.

## 2018-06-26 NOTE — ED Triage Notes (Signed)
Pt states he had mechanical injury 2 month prior and has had pain since. Pt states the pain has increased with intensity over the last 2 months. Pt denies incontinence and maintains full ROM and mobility but does report pain with movements. Pt denies any urinary complaints.
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
## 2018-12-07 ENCOUNTER — Other Ambulatory Visit: Payer: Self-pay | Admitting: Primary Care

## 2018-12-07 DIAGNOSIS — R42 Dizziness and giddiness: Secondary | ICD-10-CM

## 2018-12-07 DIAGNOSIS — Z8673 Personal history of transient ischemic attack (TIA), and cerebral infarction without residual deficits: Secondary | ICD-10-CM

## 2018-12-07 DIAGNOSIS — H538 Other visual disturbances: Secondary | ICD-10-CM

## 2018-12-21 ENCOUNTER — Other Ambulatory Visit: Payer: Self-pay

## 2018-12-21 ENCOUNTER — Ambulatory Visit
Admission: RE | Admit: 2018-12-21 | Discharge: 2018-12-21 | Disposition: A | Discharge status: Home / Self Care | Payer: BLUE CROSS/BLUE SHIELD | Source: Ambulatory Visit | Attending: Primary Care | Admitting: Primary Care

## 2018-12-21 DIAGNOSIS — Z8673 Personal history of transient ischemic attack (TIA), and cerebral infarction without residual deficits: Secondary | ICD-10-CM | POA: Insufficient documentation

## 2018-12-21 DIAGNOSIS — H538 Other visual disturbances: Secondary | ICD-10-CM | POA: Diagnosis present

## 2018-12-21 DIAGNOSIS — R42 Dizziness and giddiness: Secondary | ICD-10-CM | POA: Diagnosis not present

## 2018-12-21 HISTORY — DX: Essential (primary) hypertension: I10

## 2018-12-21 LAB — POCT I-STAT CREATININE: Creatinine, Ser: 1.1 mg/dL (ref 0.61–1.24)

## 2018-12-21 MED ORDER — IOHEXOL 350 MG/ML SOLN
75.0000 mL | Freq: Once | INTRAVENOUS | Status: AC | PRN
  Administered 2018-12-21: 15:00:00 75 mL via INTRAVENOUS

## 2019-02-27 IMAGING — CR DG LUMBAR SPINE COMPLETE 4+V
5 series · 5 of 5 positions shown · non-contrast
Comparison: None.

CLINICAL DATA: Right-sided low back pain for 1 month

EXAM:
LUMBAR SPINE - COMPLETE 4+ VIEW

[l-spine ap]
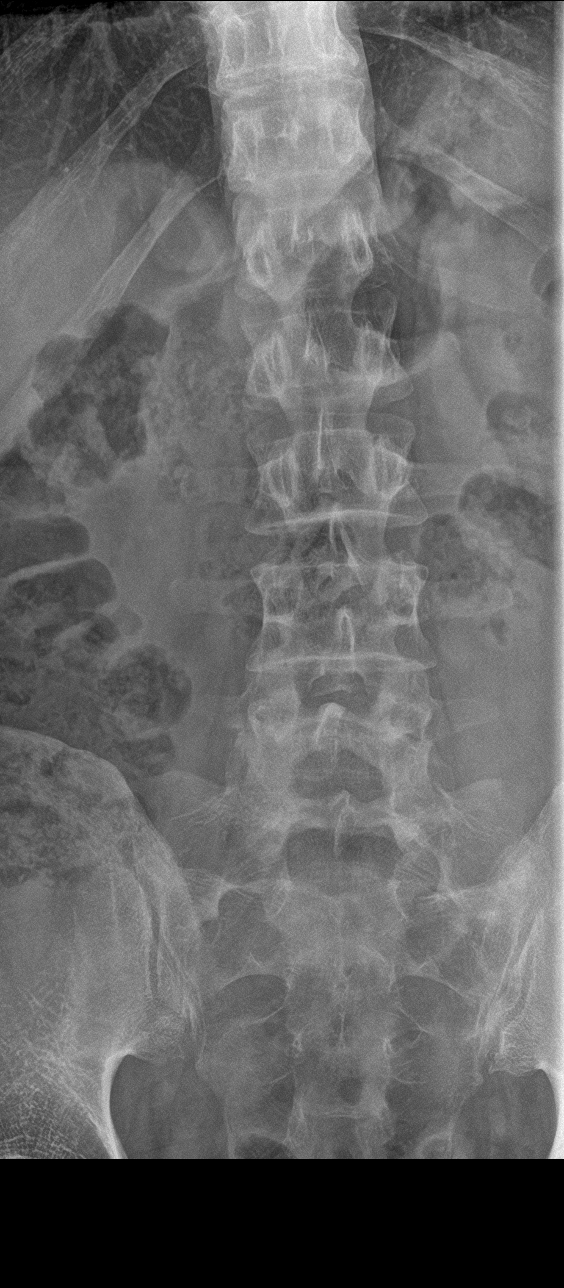

[l-spine obl (1 of 2)]
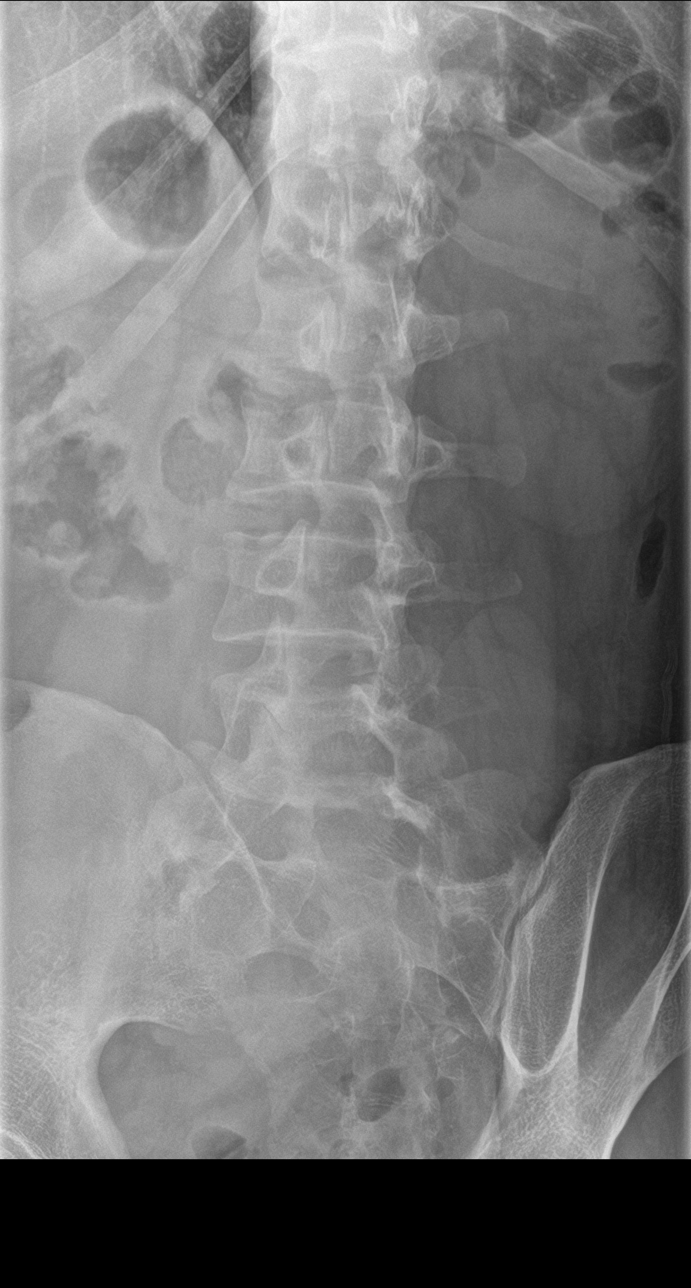

[l-spine obl (2 of 2)]
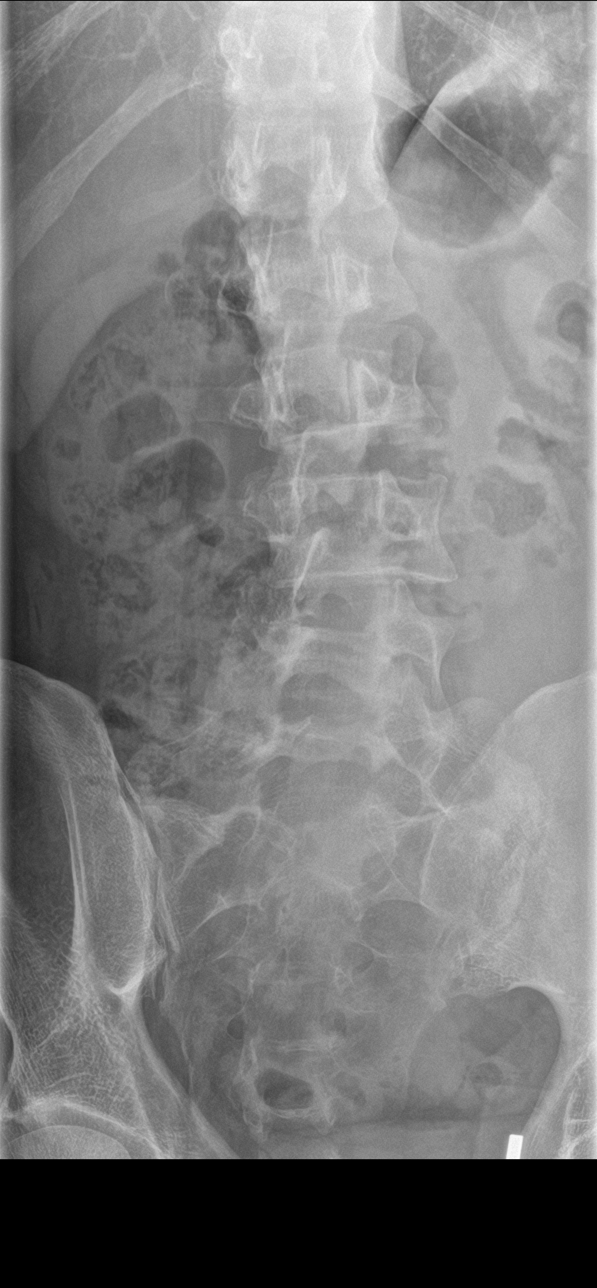

[l-spine lat]
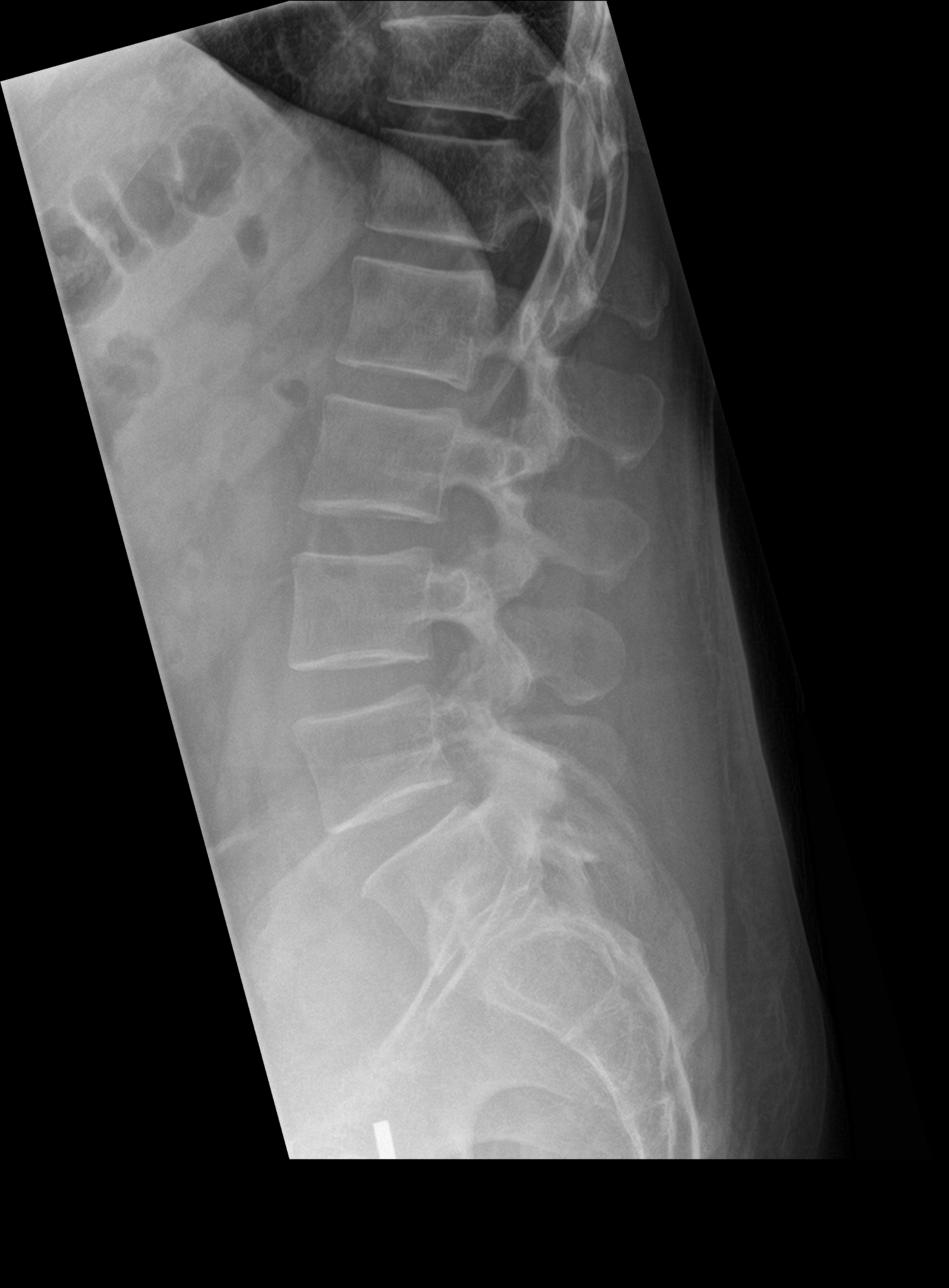

[l-spine spot]
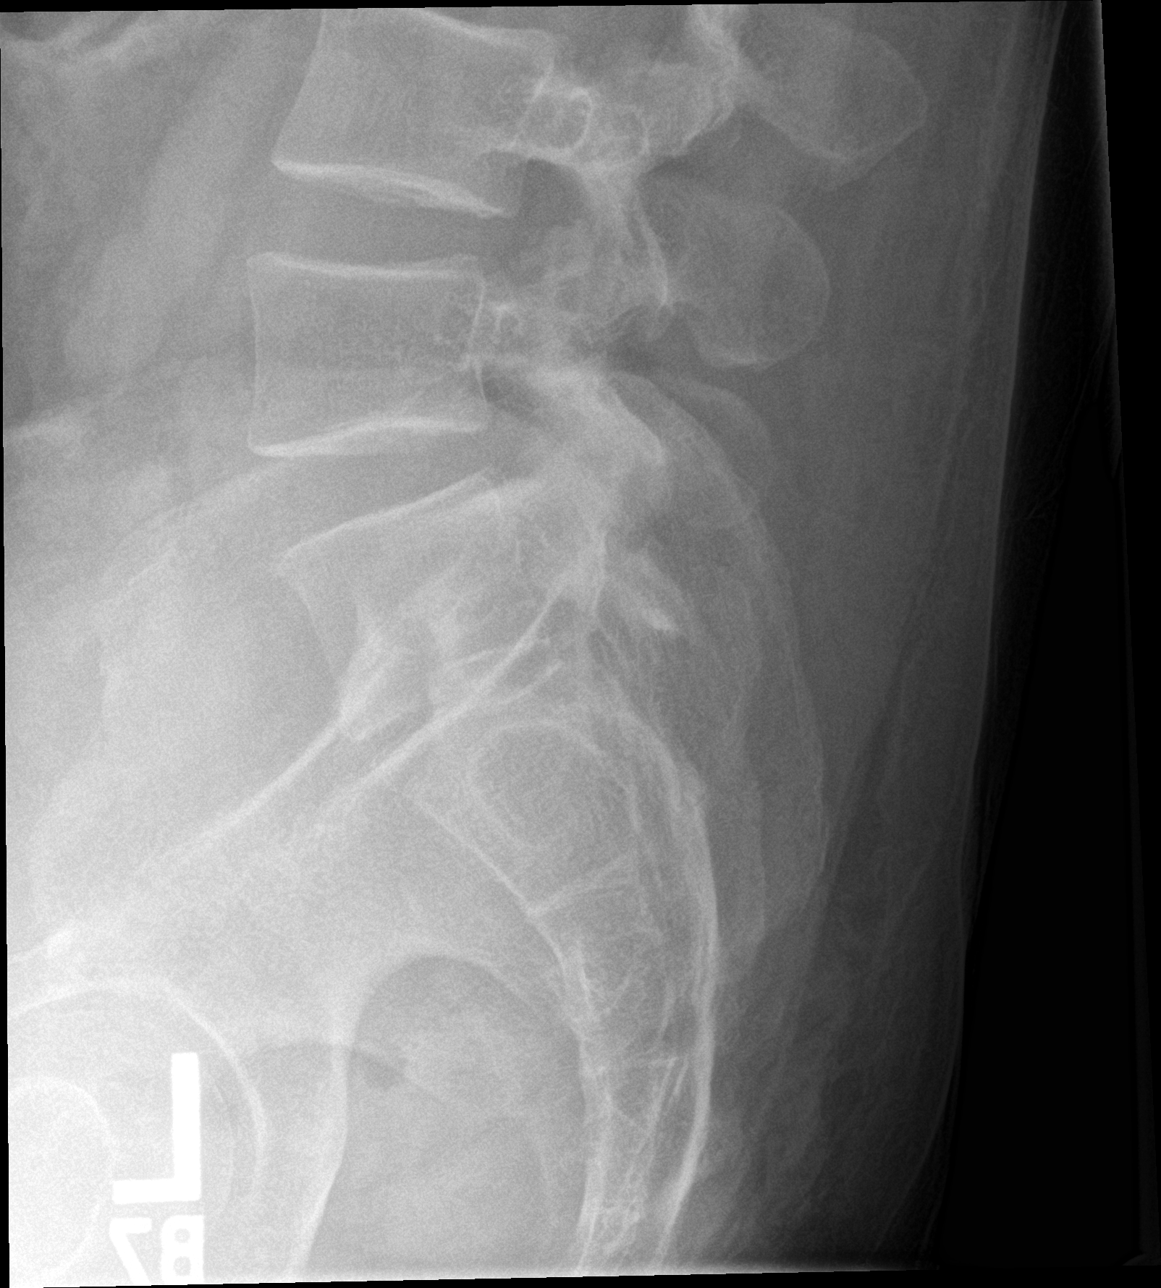

[5 of 5 positions shown; findings below may reference images not displayed]

FINDINGS: Five lumbar type vertebral bodies are well visualized. Vertebral
body height is well maintained. The fifth lumbar vertebra is
partially sacralized. No acute facet abnormality is noted. Mild
facet hypertrophic changes are seen. No anterolisthesis is noted. No
soft tissue changes are noted.
IMPRESSION: No acute abnormality seen.

## 2020-08-21 ENCOUNTER — Emergency Department (HOSPITAL_COMMUNITY)
Admission: EM | Admit: 2020-08-21 | Discharge: 2020-08-22 | Disposition: A | Discharge status: Home / Self Care | Payer: Self-pay | Attending: Emergency Medicine | Admitting: Emergency Medicine

## 2020-08-21 ENCOUNTER — Encounter (HOSPITAL_COMMUNITY): Payer: Self-pay | Admitting: Emergency Medicine

## 2020-08-21 ENCOUNTER — Emergency Department (HOSPITAL_COMMUNITY): Payer: Self-pay

## 2020-08-21 ENCOUNTER — Other Ambulatory Visit: Payer: Self-pay

## 2020-08-21 DIAGNOSIS — R066 Hiccough: Secondary | ICD-10-CM | POA: Insufficient documentation

## 2020-08-21 DIAGNOSIS — Z87891 Personal history of nicotine dependence: Secondary | ICD-10-CM | POA: Insufficient documentation

## 2020-08-21 DIAGNOSIS — S30810A Abrasion of lower back and pelvis, initial encounter: Secondary | ICD-10-CM | POA: Insufficient documentation

## 2020-08-21 DIAGNOSIS — Z7901 Long term (current) use of anticoagulants: Secondary | ICD-10-CM | POA: Insufficient documentation

## 2020-08-21 DIAGNOSIS — Z7982 Long term (current) use of aspirin: Secondary | ICD-10-CM | POA: Insufficient documentation

## 2020-08-21 DIAGNOSIS — W06XXXA Fall from bed, initial encounter: Secondary | ICD-10-CM | POA: Insufficient documentation

## 2020-08-21 DIAGNOSIS — I10 Essential (primary) hypertension: Secondary | ICD-10-CM | POA: Insufficient documentation

## 2020-08-21 LAB — CBC WITH DIFFERENTIAL/PLATELET
Abs Immature Granulocytes: 0.08 10*3/uL — ABNORMAL HIGH (ref 0.00–0.07)
Basophils Absolute: 0.1 10*3/uL (ref 0.0–0.1)
Basophils Relative: 1 %
Eosinophils Absolute: 0.4 10*3/uL (ref 0.0–0.5)
Eosinophils Relative: 3 %
HCT: 45.6 % (ref 39.0–52.0)
Hemoglobin: 15.6 g/dL (ref 13.0–17.0)
Immature Granulocytes: 1 %
Lymphocytes Relative: 26 %
Lymphs Abs: 2.8 10*3/uL (ref 0.7–4.0)
MCH: 31.3 pg (ref 26.0–34.0)
MCHC: 34.2 g/dL (ref 30.0–36.0)
MCV: 91.6 fL (ref 80.0–100.0)
Monocytes Absolute: 0.9 10*3/uL (ref 0.1–1.0)
Monocytes Relative: 9 %
Neutro Abs: 6.7 10*3/uL (ref 1.7–7.7)
Neutrophils Relative %: 60 %
Platelets: 368 10*3/uL (ref 150–400)
RBC: 4.98 MIL/uL (ref 4.22–5.81)
RDW: 13.9 % (ref 11.5–15.5)
WBC: 10.9 10*3/uL — ABNORMAL HIGH (ref 4.0–10.5)
nRBC: 0 % (ref 0.0–0.2)

## 2020-08-21 LAB — TROPONIN I (HIGH SENSITIVITY): Troponin I (High Sensitivity): 4 ng/L (ref <18)

## 2020-08-21 LAB — COMPREHENSIVE METABOLIC PANEL
ALT: 36 U/L (ref 0–44)
AST: 26 U/L (ref 15–41)
Albumin: 3.7 g/dL (ref 3.5–5.0)
Alkaline Phosphatase: 87 U/L (ref 38–126)
Anion gap: 10 (ref 5–15)
BUN: 16 mg/dL (ref 6–20)
CO2: 21 mmol/L — ABNORMAL LOW (ref 22–32)
Calcium: 9.1 mg/dL (ref 8.9–10.3)
Chloride: 108 mmol/L (ref 98–111)
Creatinine, Ser: 1.13 mg/dL (ref 0.61–1.24)
GFR, Estimated: >60 mL/min (ref >60)
Glucose, Bld: 94 mg/dL (ref 70–99)
Potassium: 4.2 mmol/L (ref 3.5–5.1)
Sodium: 139 mmol/L (ref 135–145)
Total Bilirubin: 0.7 mg/dL (ref 0.3–1.2)
Total Protein: 6.6 g/dL (ref 6.5–8.1)

## 2020-08-21 MED ORDER — PANTOPRAZOLE SODIUM 40 MG IV SOLR
40.0000 mg | Freq: Once | INTRAVENOUS | Status: AC
  Administered 2020-08-21: 40 mg via INTRAVENOUS
  Filled 2020-08-21: qty 40

## 2020-08-21 NOTE — ED Provider Notes (Signed)
[facility] EMERGENCY DEPARTMENT Provider Note   CSN: [id number] Arrival date & time: 08/21/20  1529     History Chief Complaint  Patient presents with  . Hiccups    Steven Mclean is a 44 y.o. male.  Patient is a 44 year old male who presents with hiccups.  He has a history of hypertension, prior stroke and hiatal hernia.  He reportedly has some dysphagia and increased saliva with reflux that he says is been worse since his stroke.  He reports today with a 2-day history of hiccups.  He told me they have been constant although he was not hiccuping on my evaluation.  He says that sometimes they will stop which apparently they have done at this point.  When asked him if he is having chest pain, he says that just a little bit between his lungs.  He points to the center of his chest.  He describes as a burning sensation.  He does have a history of reflux and he says he has been out of his antiacids for a while because he says he cannot afford them.  He says when his reflux gets worse, his saliva increases and he has to spit it out.  This seems to be mostly chronic issue but is gotten worse with his hiccups over the last 2 days.  He has no shortness of breath.  No cough or cold symptoms.  No fevers.  No other recent illnesses.  He denies any abdominal pain.  I did ask him to put a mask on while we were in the room and he became very confrontational regarding this, but ultimately did comply.        Past Medical History:  Diagnosis Date  . Hypertension   . Kidney stone   . MRSA (methicillin resistant Staphylococcus aureus)   . Staph infection   . Stroke [facility]) 05/2015    Patient Active Problem List   Diagnosis Date Noted  . CVA (cerebral infarction) 06/09/2015    Past Surgical History:  Procedure Laterality Date  . DENTAL SURGERY         Family History  Problem Relation Age of Onset  . Deep vein thrombosis Mother   . Stroke Other     Social History    Tobacco Use  . Smoking status: Former Smoker    Packs/day: 1.00    Types: Cigarettes    Quit date: 06/27/2015    Years since quitting: 5.1  . Smokeless tobacco: Never Used  Vaping Use  . Vaping Use: Never used  Substance Use Topics  . Alcohol use: No  . Drug use: No    Home Medications Prior to Admission medications   Medication Sig Start Date End Date Taking? Authorizing Provider  aspirin EC 81 MG tablet Take 1 tablet (81 mg total) by mouth daily. 08/23/17  Yes Don Perking, [place], MD  atorvastatin (LIPITOR) 80 MG tablet Take 1 tablet (80 mg total) by mouth daily at 6 PM. 06/13/15  Yes Gouru, Aruna, MD  clopidogrel (PLAVIX) 75 MG tablet Take 1 tablet (75 mg total) by mouth daily. 08/23/17  Yes Don Perking, [place], MD  cyclobenzaprine (FLEXERIL) 5 MG tablet Take 1 tablet (5 mg total) by mouth 3 (three) times daily as needed for muscle spasms. 06/26/18  Yes Menshew, Charlesetta Ivory, PA-C  pantoprazole (PROTONIX) 40 MG tablet Take 40 mg by mouth daily.   Yes [provider]    Allergies    Patient has no known allergies.  Review of Systems   Review of Systems  Constitutional: Negative for chills, diaphoresis, fatigue and fever.  HENT: Negative for congestion, rhinorrhea, sneezing, trouble swallowing and voice change.        Increased saliva  Eyes: Negative.   Respiratory: Negative for cough, chest tightness and shortness of breath.   Cardiovascular: Positive for chest pain ("pain between my lungs"). Negative for leg swelling.  Gastrointestinal: Negative for abdominal pain, blood in stool, diarrhea, nausea and vomiting.       Hiccups  Genitourinary: Negative for difficulty urinating, flank pain, frequency and hematuria.  Musculoskeletal: Negative for arthralgias and back pain.  Skin: Negative for rash.  Neurological: Negative for dizziness, speech difficulty, weakness, numbness and headaches.    Physical Exam Updated Vital Signs BP (!) 139/93   Pulse 61   Temp 98.6  F (37 C) (Oral)   Resp 15   Ht 5\' 8"  (1.727 m)   Wt 81.6 kg   SpO2 97%   BMI 27.37 kg/m   Physical Exam Constitutional:      Appearance: He is well-developed and well-nourished.     Comments: Patient has no current hiccups  HENT:     Head: Normocephalic and atraumatic.     Mouth/Throat:     Comments: No drooling, no hoarseness, moist mucous membranes Eyes:     Pupils: Pupils are equal, round, and reactive to light.  Cardiovascular:     Rate and Rhythm: Normal rate and regular rhythm.     Heart sounds: Normal heart sounds.  Pulmonary:     Effort: Pulmonary effort is normal. No respiratory distress.     Breath sounds: Normal breath sounds. No wheezing or rales.  Chest:     Chest wall: No tenderness.  Abdominal:     General: Bowel sounds are normal.     Palpations: Abdomen is soft.     Tenderness: There is no abdominal tenderness. There is no guarding or rebound.  Musculoskeletal:        General: No edema. Normal range of motion.     Cervical back: Normal range of motion and neck supple.  Lymphadenopathy:     Cervical: No cervical adenopathy.  Skin:    General: Skin is warm and dry.     Findings: No rash.  Neurological:     Mental Status: He is alert and oriented to person, place, and time.  Psychiatric:        Mood and Affect: Mood and affect normal.     ED Results / Procedures / Treatments   Labs (all labs ordered are listed, but only abnormal results are displayed) Labs Reviewed  CBC WITH DIFFERENTIAL/PLATELET - Abnormal; Notable for the following components:      Result Value   WBC 10.9 (*)    Abs Immature Granulocytes 0.08 (*)    All other components within normal limits  COMPREHENSIVE METABOLIC PANEL - Abnormal; Notable for the following components:   CO2 21 (*)    All other components within normal limits  TROPONIN I (HIGH SENSITIVITY)  TROPONIN I (HIGH SENSITIVITY)    EKG EKG Interpretation  Date/Time:  Monday August 21 2020 21:01:48  EST Ventricular Rate:  73 PR Interval:    QRS Duration: 94 QT Interval:  380 QTC Calculation: 419 R Axis:   -59 Text Interpretation: Sinus rhythm Left anterior fascicular block Abnormal R-wave progression, early transition ST elevation suggests acute pericarditis since last tracing no significant change Confirmed by [id number] Rolan Bucco) on 08/21/2020 9:21:56 PM  Radiology DG Chest 2 View  Result Date: 08/21/2020 CLINICAL DATA:  Hiccups, chest pain. EXAM: CHEST - 2 VIEW COMPARISON:  None. FINDINGS: The cardiomediastinal contours are normal. The lungs are clear. Pulmonary vasculature is normal. No consolidation, pleural effusion, or pneumothorax. No acute osseous abnormalities are seen. IMPRESSION: Negative radiographs of the chest. Electronically Signed   By: Narda Rutherford M.D.   On: 08/21/2020 21:35    Procedures Procedures (including critical care time)  Medications Ordered in ED Medications  pantoprazole (PROTONIX) injection 40 mg (40 mg Intravenous Given 08/21/20 2130)    ED Course  I have reviewed the triage vital signs and the nursing notes.  Pertinent labs & imaging results that were available during my care of the patient were reviewed by me and considered in my medical decision making (see chart for details).    MDM Rules/Calculators/A&P                          Patient is a 44 year old male who presents with hiccups.  They seem to be intermittent.  When I evaluate him twice he has not had any hiccups.  It seems to be related to GERD.  He had some improvement in symptoms after getting IV Protonix.  His increased salivation resolved.  He does not have any throat pain or concerns for throat infection.  No difficulty controlling his secretions.  No abdominal pain on exam.  His chest x-ray does not show any concerning findings.  No pleural effusion or evidence of infection.  He later said that he had a minor back injury a few days ago.  He rolled off the bed when he was  "playing around with his wife".  He has a very small abrasion to his right mid back.  There is no spinal tenderness.  There is some very minimal tenderness over the ribs but chest x-ray does not show any obvious rib fracture.  No pneumothorax.  He does not have a abdominal pain that would be more concerning for traumatic injury.  I feel his hiccups are likely related to his GERD.  He will start his antiacid medications tomorrow.  He has a prescription and can pick it up at the pharmacy tomorrow.  He has an appointment to follow-up with his PCP on Thursday.  Return precautions were given.  He did report some minor chest pain which he describes his pain "between his lungs".  He really only seems to have discomfort there when he has the hiccups.  I have a very low suspicion for ACS.  He does not have any ischemic changes on EKG.  His troponin is negative.  His second troponin is pending.  He will be discharged by the oncoming provider assuming his second troponin is negative. Final Clinical Impression(s) / ED Diagnoses Final diagnoses:  Hiccups    Rx / DC Orders ED Discharge Orders    None       Rolan Bucco, MD 08/22/20 0002

## 2020-08-21 NOTE — ED Notes (Signed)
Patient transported to X-ray 

## 2020-08-21 NOTE — Discharge Instructions (Addendum)
Take your antacid medicine as prescribed.  Start taking it tomorrow.  Follow-up with your doctor on Thursday as scheduled.  Maintain a bland diet.  Avoid drinking alcohol.  Return here as needed if you have any worsening symptoms.

## 2020-08-21 NOTE — ED Triage Notes (Addendum)
Patient arrives to ED with c/o hiccups. Pt states hiccups constantly x1.5 days. Started yesterday morning after drinking moonshine the night before. Started suddenly. States epigastric area feels tight due to hiccups. Associated with nausea, no vomiting. No CP or SOB.

## 2020-08-22 LAB — TROPONIN I (HIGH SENSITIVITY): Troponin I (High Sensitivity): 3 ng/L (ref <18)

## 2020-08-22 NOTE — ED Notes (Signed)
Pt eloped from room, IV found in trash can

## 2022-12-13 ENCOUNTER — Other Ambulatory Visit: Payer: Self-pay

## 2022-12-13 ENCOUNTER — Emergency Department
Admission: EM | Admit: 2022-12-13 | Discharge: 2022-12-14 | Discharge status: Against Medical Advice | Payer: Self-pay | Attending: Emergency Medicine | Admitting: Emergency Medicine

## 2022-12-13 DIAGNOSIS — Z5321 Procedure and treatment not carried out due to patient leaving prior to being seen by health care provider: Secondary | ICD-10-CM | POA: Insufficient documentation

## 2022-12-13 DIAGNOSIS — R1084 Generalized abdominal pain: Secondary | ICD-10-CM | POA: Insufficient documentation

## 2022-12-13 LAB — CBC
HCT: 46.2 % (ref 39.0–52.0)
Hemoglobin: 15.4 g/dL (ref 13.0–17.0)
MCH: 30.3 pg (ref 26.0–34.0)
MCHC: 33.3 g/dL (ref 30.0–36.0)
MCV: 90.8 fL (ref 80.0–100.0)
Platelets: 388 10*3/uL (ref 150–400)
RBC: 5.09 MIL/uL (ref 4.22–5.81)
RDW: 13.8 % (ref 11.5–15.5)
WBC: 11 10*3/uL — ABNORMAL HIGH (ref 4.0–10.5)
nRBC: 0 % (ref 0.0–0.2)

## 2022-12-13 LAB — COMPREHENSIVE METABOLIC PANEL
ALT: 35 U/L (ref 0–44)
AST: 22 U/L (ref 15–41)
Albumin: 3.9 g/dL (ref 3.5–5.0)
Alkaline Phosphatase: 87 U/L (ref 38–126)
Anion gap: 9 (ref 5–15)
BUN: 14 mg/dL (ref 6–20)
CO2: 22 mmol/L (ref 22–32)
Calcium: 9.6 mg/dL (ref 8.9–10.3)
Chloride: 107 mmol/L (ref 98–111)
Creatinine, Ser: 1.03 mg/dL (ref 0.61–1.24)
GFR, Estimated: >60 mL/min (ref >60)
Glucose, Bld: 115 mg/dL — ABNORMAL HIGH (ref 70–99)
Potassium: 3.9 mmol/L (ref 3.5–5.1)
Sodium: 138 mmol/L (ref 135–145)
Total Bilirubin: 0.6 mg/dL (ref 0.3–1.2)
Total Protein: 7.2 g/dL (ref 6.5–8.1)

## 2022-12-13 LAB — LIPASE, BLOOD: Lipase: 47 U/L (ref 11–51)

## 2022-12-13 NOTE — ED Triage Notes (Signed)
Pt reports generalized abdominal pain, pt denies any nausea, vomiting or diarrhea. No blood in urine that he can report, pt reports last BM this morning. Pt talks in complete sentences no respiratory distress noted
